# Patient Record
Sex: Male | Born: 1980 | Race: White | Hispanic: No | Marital: Married | Smoking: Never smoker
Health system: Southern US, Community
[De-identification: ages and names within clinical notes are randomized; demographics above are authoritative.]

---

## 2005-02-16 ENCOUNTER — Emergency Department: Payer: Self-pay | Admitting: Emergency Medicine

## 2006-02-02 HISTORY — PX: NASAL SINUS SURGERY: SHX719

## 2006-11-27 ENCOUNTER — Emergency Department: Payer: Self-pay | Admitting: Emergency Medicine

## 2007-01-13 ENCOUNTER — Ambulatory Visit: Payer: Self-pay | Admitting: Otolaryngology

## 2007-08-19 ENCOUNTER — Emergency Department: Payer: Self-pay | Admitting: Emergency Medicine

## 2009-02-18 ENCOUNTER — Ambulatory Visit: Payer: Self-pay | Admitting: Gastroenterology

## 2011-03-30 ENCOUNTER — Emergency Department: Payer: Self-pay | Admitting: General Practice

## 2011-07-31 ENCOUNTER — Ambulatory Visit (INDEPENDENT_AMBULATORY_CARE_PROVIDER_SITE_OTHER): Payer: PRIVATE HEALTH INSURANCE | Admitting: Internal Medicine

## 2011-07-31 ENCOUNTER — Encounter: Payer: Self-pay | Admitting: Internal Medicine

## 2011-07-31 VITALS — BP 136/82 | HR 85 | Temp 98.6°F | Resp 16 | Ht 73.0 in | Wt 211.8 lb

## 2011-07-31 DIAGNOSIS — Z0000 Encounter for general adult medical examination without abnormal findings: Secondary | ICD-10-CM

## 2011-07-31 DIAGNOSIS — L255 Unspecified contact dermatitis due to plants, except food: Secondary | ICD-10-CM

## 2011-07-31 DIAGNOSIS — L309 Dermatitis, unspecified: Secondary | ICD-10-CM

## 2011-07-31 DIAGNOSIS — R030 Elevated blood-pressure reading, without diagnosis of hypertension: Secondary | ICD-10-CM

## 2011-07-31 DIAGNOSIS — L259 Unspecified contact dermatitis, unspecified cause: Secondary | ICD-10-CM

## 2011-07-31 DIAGNOSIS — M7552 Bursitis of left shoulder: Secondary | ICD-10-CM

## 2011-07-31 DIAGNOSIS — M719 Bursopathy, unspecified: Secondary | ICD-10-CM

## 2011-07-31 LAB — CBC WITH DIFFERENTIAL/PLATELET
Basophils Absolute: 0.1 10*3/uL (ref 0.0–0.1)
Basophils Relative: 1 % (ref 0–1)
Eosinophils Absolute: 0.4 10*3/uL (ref 0.0–0.7)
HCT: 46.8 % (ref 39.0–52.0)
Hemoglobin: 16 g/dL (ref 13.0–17.0)
MCH: 29.3 pg (ref 26.0–34.0)
MCHC: 34.2 g/dL (ref 30.0–36.0)
Monocytes Absolute: 0.5 10*3/uL (ref 0.1–1.0)
Monocytes Relative: 7 % (ref 3–12)
Neutro Abs: 4.1 10*3/uL (ref 1.7–7.7)
Neutrophils Relative %: 51 % (ref 43–77)
RDW: 13.7 % (ref 11.5–15.5)

## 2011-07-31 MED ORDER — PREDNISONE (PAK) 10 MG PO TABS: ORAL_TABLET | ORAL | Status: AC

## 2011-07-31 NOTE — Patient Instructions (Addendum)
Get your bp and pulse checked a few times over the next  Couple of week  If the numbers  Are consistently > 140/80,  We will initiate treatment   I am prescribing a 10 day course of prednisone for your dermatitis and shoulder pain  If the rash gets worse,  Call immediately   Try allegra for daytime itching fenxofenadine 180 mg dose once daily

## 2011-07-31 NOTE — Progress Notes (Signed)
Patient ID: Anthony [name], male   DOB: Dec 10, 1980, 31 y.o.   MRN: [id number] Patient Active Problem List  Diagnosis  . Elevated blood pressure reading without diagnosis of hypertension  . Contact dermatitis and other eczema due to plants (except food)  . Bursitis of left shoulder    Subjective:  CC:   Chief Complaint  Patient presents with  . Annual Exam    HPI:   Anthony [name] a 31 y.o. male who presents for an Annual exam  Cc left shoulder pain aggravated by weight lifting anterior pain.  FH of temporal arteritis in grandfather,  Patient has persistent bilateral temple pain for months, no  vision changes . 3rd issue contact dermatitis chest  forearms legs after contact with bales hay. Still working 2 week of nights, 2 weeks of days for [employer].    History reviewed. No pertinent past medical history.  History reviewed. No pertinent past surgical history.       The following portions of the patient's history were reviewed and updated as appropriate: Allergies, current medications, and problem list.    Review of Systems:   12 Pt  review of systems was negative except those addressed in the HPI,     History   Social History  . Marital Status: Married    Spouse Name: N/A    Number of Children: N/A  . Years of Education: N/A   Occupational History  . Not on file.   Social History Main Topics  . Smoking status: Never Smoker   . Smokeless tobacco: Never Used  . Alcohol Use: Yes  . Drug Use: No  . Sexually Active: Not on file   Other Topics Concern  . Not on file   Social History Narrative  . No narrative on file    Objective:  BP 136/82  Pulse 85  Temp 98.6 F (37 C) (Oral)  Resp 16  Ht 6\' 1"  (1.854 m)  Wt 211 lb 12 oz (96.049 kg)  BMI 27.94 kg/m2  SpO2 96% BP 136/82  Pulse 85  Temp 98.6 F (37 C) (Oral)  Resp 16  Ht 6\' 1"  (1.854 m)  Wt 211 lb 12 oz (96.049 kg)  BMI 27.94 kg/m2  SpO2 96%  General Appearance:    Alert, cooperative,  no distress, appears stated age  Head:    Normocephalic, without obvious abnormality, atraumatic  Eyes:    PERRL, conjunctiva/corneas clear, EOM's intact, fundi    benign, both eyes       Ears:    Normal TM's and external ear canals, both ears  Nose:   Nares normal, septum midline, mucosa normal, no drainage   or sinus tenderness  Throat:   Lips, mucosa, and tongue normal; teeth and gums normal  Neck:   Supple, symmetrical, trachea midline, no adenopathy;       thyroid:  No enlargement/tenderness/nodules; no carotid   bruit or JVD  Back:     Symmetric, no curvature, ROM normal, no CVA tenderness  Lungs:     Clear to auscultation bilaterally, respirations unlabored  Chest wall:    No tenderness or deformity  Heart:    Regular rate and rhythm, S1 and S2 normal, no murmur, rub   or gallop  Abdomen:     Soft, non-tender, bowel sounds active all four quadrants,    no masses, no organomegaly  Genitalia:    Normal male without lesion, discharge or tenderness     Extremities:   Extremities normal, atraumatic, no  cyanosis or edema  Pulses:   2+ and symmetric all extremities  Skin:   Skin color, texture, turgor normal, no rashes or lesions  Lymph nodes:   Cervical, supraclavicular, and axillary nodes normal  Neurologic:   CNII-XII intact. Normal strength, sensation and reflexes      throughout       Assessment and Plan:  Elevated blood pressure reading without diagnosis of hypertension He was asked to check bp and pulse several times will over the next few weeks . Will start therapy if elevated.   Contact dermatitis and other eczema due to plants (except food) Steroid taper prescribed  Bursitis of left shoulder Steroid injection discussed but deferred since we are using systemic steroids for dermatitis.   Updated Medication List Outpatient Encounter Prescriptions as of 07/31/2011  Medication Sig Dispense Refill  . predniSONE (STERAPRED UNI-PAK) 10 MG tablet 6 tablets daily for 3  days,  Then taper by 1 tablet daily until gone  21 tablet  0     Orders Placed This Encounter  Procedures  . COMPLETE METABOLIC PANEL WITH GFR  . CBC with Differential  . TSH  . HIV Antibody ( Reflex)  . Hepatitis C antibody    No Follow-up on file.

## 2011-08-01 LAB — COMPLETE METABOLIC PANEL WITH GFR
ALT: 30 U/L (ref 0–53)
Albumin: 4.6 g/dL (ref 3.5–5.2)
CO2: 27 mEq/L (ref 19–32)
GFR, Est African American: >89 mL/min
GFR, Est Non African American: 85 mL/min
Glucose, Bld: 80 mg/dL (ref 70–99)
Potassium: 4.1 mEq/L (ref 3.5–5.3)
Sodium: 142 mEq/L (ref 135–145)
Total Bilirubin: 1.1 mg/dL (ref 0.3–1.2)
Total Protein: 6.6 g/dL (ref 6.0–8.3)

## 2011-08-02 DIAGNOSIS — M7552 Bursitis of left shoulder: Secondary | ICD-10-CM | POA: Insufficient documentation

## 2011-08-02 DIAGNOSIS — L255 Unspecified contact dermatitis due to plants, except food: Secondary | ICD-10-CM | POA: Insufficient documentation

## 2011-08-02 DIAGNOSIS — R030 Elevated blood-pressure reading, without diagnosis of hypertension: Secondary | ICD-10-CM | POA: Insufficient documentation

## 2011-08-02 NOTE — Assessment & Plan Note (Signed)
Steroid injection discussed but deferred since we are using systemic steroids for dermatitis.

## 2011-08-02 NOTE — Assessment & Plan Note (Signed)
He was asked to check bp and pulse several times will over the next few weeks . Will start therapy if elevated.

## 2011-08-02 NOTE — Assessment & Plan Note (Signed)
Steroid taper prescribed

## 2011-11-10 ENCOUNTER — Encounter: Payer: Self-pay | Admitting: Internal Medicine

## 2012-01-08 ENCOUNTER — Ambulatory Visit (INDEPENDENT_AMBULATORY_CARE_PROVIDER_SITE_OTHER): Payer: PRIVATE HEALTH INSURANCE | Admitting: Internal Medicine

## 2012-01-08 ENCOUNTER — Encounter: Payer: Self-pay | Admitting: Internal Medicine

## 2012-01-08 VITALS — BP 124/70 | HR 82 | Temp 98.1°F | Resp 12 | Ht 72.0 in | Wt 229.8 lb

## 2012-01-08 DIAGNOSIS — IMO0002 Reserved for concepts with insufficient information to code with codable children: Secondary | ICD-10-CM

## 2012-01-08 DIAGNOSIS — M75100 Unspecified rotator cuff tear or rupture of unspecified shoulder, not specified as traumatic: Secondary | ICD-10-CM

## 2012-01-08 DIAGNOSIS — M7550 Bursitis of unspecified shoulder: Secondary | ICD-10-CM

## 2012-01-08 MED ORDER — MELOXICAM 7.5 MG PO TABS: 7.5000 mg | ORAL_TABLET | Freq: Every day | ORAL | Status: DC

## 2012-01-08 MED ORDER — TRAMADOL HCL 50 MG PO TABS: 50.0000 mg | ORAL_TABLET | Freq: Four times a day (QID) | ORAL | Status: DC | PRN

## 2012-01-08 NOTE — Patient Instructions (Signed)
I injected your shoulder with DepoMedrol and lidocaine today for sub acromial bursitis.  PLEASE restrict your overhead use of arm for 2 weeks.  PLEASE DO NOT BENCH PRESS OR SHOULDER PRESS for as long as you can possibly stand it (2 weeks)   Ice shoulder frequently for up to 15 minutes  Ok to take meloxicam once daily and tramadol (50 mg every 6 hours ) as needed for pain

## 2012-01-08 NOTE — Assessment & Plan Note (Addendum)
Left shoulder,  Informed consent obtained.  Injection of depo medrol , 40 mg  And 2 mg of 2%  xylocaine given .  Patient advised to abstian fro mall ovewhead lifting with arm for 2 week minimum and avoid bench and shoulder press as well.

## 2012-01-10 ENCOUNTER — Encounter: Payer: Self-pay | Admitting: Internal Medicine

## 2012-01-10 MED ORDER — LIDOCAINE HCL 2 % IJ SOLN: 2.5000 mL | Freq: Once | INTRAMUSCULAR | Status: DC

## 2012-01-10 MED ORDER — METHYLPREDNISOLONE ACETATE 40 MG/ML IJ SUSP: 40.0000 mg | Freq: Once | INTRAMUSCULAR | Status: DC

## 2012-01-10 NOTE — Progress Notes (Addendum)
Patient ID: Anthony [name], male   DOB: [birth year], 31 y.o.   MRN: [id number]  Patient Active Problem List  Diagnosis  . Elevated blood pressure reading without diagnosis of hypertension  . Contact dermatitis and other eczema due to plants (except food)  . Bursitis of left shoulder  . Subacromial bursitis    Subjective:  CC:   Chief Complaint  Patient presents with  . Shoulder Pain    HPI:   Anthony [name] a 31 y.o. male who presents Persistent left shoulder pain. Patient is requesting a steroid injection in his shoulder for continued pain aggravated by bench pressing and shoulder press.  There is no history of trauma or steroid use. No weakness or pain radiating past the the deltoid muscle. No other joints affected.    History reviewed. No pertinent past medical history.  History reviewed. No pertinent past surgical history.     . lidocaine  2.5 mL Other Once  . methylPREDNISolone acetate  40 mg Intra-articular Once     The following portions of the patient's history were reviewed and updated as appropriate: Allergies, current medications, and problem list.    Review of Systems:   12 Pt  review of systems was negative except those addressed in the HPI,     History   Social History  . Marital Status: Married    Spouse Name: N/A    Number of Children: N/A  . Years of Education: N/A   Occupational History  . Not on file.   Social History Main Topics  . Smoking status: Never Smoker   . Smokeless tobacco: Never Used  . Alcohol Use: Yes  . Drug Use: No  . Sexually Active: Not on file   Other Topics Concern  . Not on file   Social History Narrative  . No narrative on file    Objective:  BP 124/70  Pulse 82  Temp 98.1 F (36.7 C) (Oral)  Resp 12  Ht 6' (1.829 m)  Wt 229 lb 12 oz (104.214 kg)  BMI 31.16 kg/m2  SpO2 97%  General appearance: alert, cooperative and appears stated age  Neck: no adenopathy, no carotid bruit, supple, symmetrical,  trachea midline and thyroid not enlarged, symmetric, no tenderness/mass/nodules Back: symmetric, no curvature. ROM normal. No CVA tenderness. Lungs: clear to auscultation bilaterally Heart: regular rate and rhythm, S1, S2 normal, no murmur, click, rub or gallop Abdomen: soft, non-tender; bowel sounds normal; no masses,  no organomegaly Pulses: 2+ and symmetric Skin: Skin color, texture, turgor normal. No rashes or lesions MSL" left shoulder without erythema.  Pt tenderness noted at humeral head aggravated by overhead motion and forced abduction   Assessment and Plan:  Subacromial bursitis Left shoulder,  Informed consent obtained.  Injection of depo medrol , 40 mg  And 2 mg of 2%  xylocaine given .  Patient advised to abstian fro mall ovewhead lifting with arm for 2 week minimum and avoid bench and shoulder press as well.    Updated Medication List Outpatient Encounter Prescriptions as of 01/08/2012  Medication Sig Dispense Refill  . meloxicam (MOBIC) 7.5 MG tablet Take 1 tablet (7.5 mg total) by mouth daily.  30 tablet  0  . traMADol (ULTRAM) 50 MG tablet Take 1 tablet (50 mg total) by mouth every 6 (six) hours as needed for pain.  60 tablet  3   Facility-Administered Encounter Medications as of 01/08/2012  Medication Dose Route Frequency Provider Last Rate Last Dose  . lidocaine (XYLOCAINE) 2 % (  with pres) injection 50 mg  2.5 mL Other Once Sherlene Shams, MD      . methylPREDNISolone acetate (DEPO-MEDROL) injection 40 mg  40 mg Intra-articular Once Sherlene Shams, MD         No orders of the defined types were placed in this encounter.    No Follow-up on file.

## 2012-02-07 ENCOUNTER — Emergency Department: Payer: Self-pay | Admitting: Emergency Medicine

## 2012-11-02 ENCOUNTER — Encounter: Payer: Self-pay | Admitting: *Deleted

## 2012-11-03 ENCOUNTER — Ambulatory Visit (INDEPENDENT_AMBULATORY_CARE_PROVIDER_SITE_OTHER): Payer: PRIVATE HEALTH INSURANCE | Admitting: Internal Medicine

## 2012-11-03 ENCOUNTER — Encounter: Payer: Self-pay | Admitting: Internal Medicine

## 2012-11-03 VITALS — BP 112/82 | HR 81 | Temp 98.1°F | Resp 14 | Ht 73.75 in | Wt 206.2 lb

## 2012-11-03 DIAGNOSIS — A6920 Lyme disease, unspecified: Secondary | ICD-10-CM

## 2012-11-03 DIAGNOSIS — R5381 Other malaise: Secondary | ICD-10-CM

## 2012-11-03 DIAGNOSIS — Z13220 Encounter for screening for lipoid disorders: Secondary | ICD-10-CM

## 2012-11-03 DIAGNOSIS — Z8619 Personal history of other infectious and parasitic diseases: Secondary | ICD-10-CM

## 2012-11-03 DIAGNOSIS — Z113 Encounter for screening for infections with a predominantly sexual mode of transmission: Secondary | ICD-10-CM

## 2012-11-03 DIAGNOSIS — Z0000 Encounter for general adult medical examination without abnormal findings: Secondary | ICD-10-CM

## 2012-11-03 NOTE — Patient Instructions (Signed)
Return for fasting labs.

## 2012-11-03 NOTE — Progress Notes (Signed)
Patient ID: Anthony [name], male   DOB: [birth year], 32 y.o.   MRN: [id number]   The patient is here for his annual male physical examination and management of other chronic and acute problems.  He was treated for Lymes disease a few months ago by [facility] Symptoms included headaches, malaise,  Joint pain and abdominal pain .   Per patient the Lyme test was positive.  Possible exposures include his work as a  Emergency planning/management officer in [employer] requiring him spending a lot of time in [employer], and had been to [facility]   Shoulder still aching but he did not rest after the steroid injection.  The pain is aggravated by bench pressing .  His wife is a massage therapist and she is treating it with several complementary therapies. .   Takes creatine and several other supplements recommended by his trainer for body building but does not use steroids.  He drinks a gallon of water daily.     The risk factors are reflected in the social history.  Home safety : The patient has smoke detectors in the home. He wears seatbelts.  There are  firearms at home. There is no violence in the home.   There is occupational  risk for hepatitis, and  HIV. There is no   history of blood transfusion. There is no travel history to infectious disease endemic areas of the world.  The patient has seen their dentist in the last six month and  their eye doctor in the last year.  They do not  have excessive sun exposure. They have seen a dermatoloigist in the last year. Discussed the need for sun protection: hats, long sleeves and use of sunscreen if there is significant sun exposure.   Diet: the importance of a healthy diet is discussed. They do have a healthy diet.  Depression screen: there are no signs or vegative symptoms of depression- irritability, change in appetite, anhedonia, sadness/tearfullness.  The following portions of the patient's history were reviewed and updated as appropriate: allergies,  current medications, past family history, past medical history,  past surgical history, past social history  and problem list.  Visual acuity was not assessed per patient preference since he has regular follow up with his ophthalmologist. Hearing and body mass index were assessed and reviewed.   During the course of the visit the patient was educated and counseled about appropriate screening and preventive services including :  nutrition counseling, colorectal cancer screening, and recommended immunizations.    Objective:  BP 112/82  Pulse 81  Temp(Src) 98.1 F (36.7 C) (Oral)  Resp 14  Ht 6' 1.75" (1.873 m)  Wt 206 lb 4 oz (93.554 kg)  BMI 26.67 kg/m2  SpO2 99%  BP 112/82  Pulse 81  Temp(Src) 98.1 F (36.7 C) (Oral)  Resp 14  Ht 6' 1.75" (1.873 m)  Wt 206 lb 4 oz (93.554 kg)  BMI 26.67 kg/m2  SpO2 99%  General Appearance:    Alert, cooperative, no distress, appears stated age  Head:    Normocephalic, without obvious abnormality, atraumatic  Eyes:    PERRL, conjunctiva/corneas clear, EOM's intact, fundi    benign, both eyes       Ears:    Normal TM's and external ear canals, both ears  Nose:   Nares normal, septum midline, mucosa normal, no drainage   or sinus tenderness  Throat:   Lips, mucosa, and tongue normal; teeth and gums normal  Neck:  Supple, symmetrical, trachea midline, no adenopathy;       thyroid:  No enlargement/tenderness/nodules; no carotid   bruit or JVD  Back:     Symmetric, no curvature, ROM normal, no CVA tenderness  Lungs:     Clear to auscultation bilaterally, respirations unlabored  Chest wall:    No tenderness or deformity  Heart:    Regular rate and rhythm, S1 and S2 normal, no murmur, rub   or gallop  Abdomen:     Soft, non-tender, bowel sounds active all four quadrants,    no masses, no organomegaly  Genitalia:    Normal male without lesion, discharge or tenderness  Rectal:    Normal tone, normal prostate, no masses or tenderness;   guaiac  negative stool  Extremities:   Extremities normal, atraumatic, no cyanosis or edema  Pulses:   2+ and symmetric all extremities  Skin:   Skin color, texture, turgor normal, no rashes or lesions  Lymph nodes:   Cervical, supraclavicular, and axillary nodes normal  Neurologic:   CNII-XII intact. Normal strength, sensation and reflexes      throughout   Assessment and Plan:  Encounter for preventive health examination Annual male exam was done including testicular and hernia  Exam. He will return for fasting labs and screening for HIV/Hep C.  Will recommend screening for TB as well.    H/o Lyme disease Per patient, treated by outside clinic. Repeat antibody testing ordered.  symptoms have resolved.    Updated Medication List Outpatient Encounter Prescriptions as of 11/03/2012  Medication Sig Dispense Refill  . [DISCONTINUED] meloxicam (MOBIC) 7.5 MG tablet Take 1 tablet (7.5 mg total) by mouth daily.  30 tablet  0  . [DISCONTINUED] traMADol (ULTRAM) 50 MG tablet Take 1 tablet (50 mg total) by mouth every 6 (six) hours as needed for pain.  60 tablet  3   Facility-Administered Encounter Medications as of 11/03/2012  Medication Dose Route Frequency Provider Last Rate Last Dose  . lidocaine (XYLOCAINE) 2 % (with pres) injection 50 mg  2.5 mL Other Once Sherlene Shams, MD      . methylPREDNISolone acetate (DEPO-MEDROL) injection 40 mg  40 mg Intra-articular Once Sherlene Shams, MD

## 2012-11-06 ENCOUNTER — Encounter: Payer: Self-pay | Admitting: Internal Medicine

## 2012-11-06 DIAGNOSIS — Z8619 Personal history of other infectious and parasitic diseases: Secondary | ICD-10-CM | POA: Insufficient documentation

## 2012-11-06 NOTE — Assessment & Plan Note (Signed)
Annual male exam was done including testicular and hernia  Exam. He will return for fasting labs and screening for HIV/Hep C.  Will recommend screening for TB as well.

## 2012-11-06 NOTE — Assessment & Plan Note (Signed)
Per patient, treated by outside clinic. Repeat antibody testing ordered.  symptoms have resolved.

## 2012-11-16 ENCOUNTER — Other Ambulatory Visit (INDEPENDENT_AMBULATORY_CARE_PROVIDER_SITE_OTHER): Payer: PRIVATE HEALTH INSURANCE

## 2012-11-16 DIAGNOSIS — Z113 Encounter for screening for infections with a predominantly sexual mode of transmission: Secondary | ICD-10-CM

## 2012-11-16 DIAGNOSIS — R5381 Other malaise: Secondary | ICD-10-CM

## 2012-11-16 DIAGNOSIS — Z8619 Personal history of other infectious and parasitic diseases: Secondary | ICD-10-CM

## 2012-11-16 DIAGNOSIS — Z13220 Encounter for screening for lipoid disorders: Secondary | ICD-10-CM

## 2012-11-16 LAB — COMPREHENSIVE METABOLIC PANEL
ALT: 44 U/L (ref 0–53)
AST: 34 U/L (ref 0–37)
Alkaline Phosphatase: 55 U/L (ref 39–117)
BUN: 23 mg/dL (ref 6–23)
Chloride: 102 mEq/L (ref 96–112)
Creatinine, Ser: 1 mg/dL (ref 0.4–1.5)
Total Bilirubin: 2.3 mg/dL — ABNORMAL HIGH (ref 0.3–1.2)

## 2012-11-16 LAB — CBC WITH DIFFERENTIAL/PLATELET
Basophils Absolute: 0 10*3/uL (ref 0.0–0.1)
Basophils Relative: 0.6 % (ref 0.0–3.0)
Eosinophils Absolute: 0.2 10*3/uL (ref 0.0–0.7)
Hemoglobin: 16.5 g/dL (ref 13.0–17.0)
Lymphocytes Relative: 32.1 % (ref 12.0–46.0)
Lymphs Abs: 2.1 10*3/uL (ref 0.7–4.0)
MCHC: 34.4 g/dL (ref 30.0–36.0)
MCV: 88 fl (ref 78.0–100.0)
Monocytes Absolute: 0.6 10*3/uL (ref 0.1–1.0)
Neutro Abs: 3.5 10*3/uL (ref 1.4–7.7)
RBC: 5.44 Mil/uL (ref 4.22–5.81)
RDW: 12.8 % (ref 11.5–14.6)
WBC: 6.5 10*3/uL (ref 4.5–10.5)

## 2012-11-16 LAB — LIPID PANEL
HDL: 54.7 mg/dL (ref >39.00)
Total CHOL/HDL Ratio: 3
VLDL: 12.4 mg/dL (ref 0.0–40.0)

## 2012-11-16 LAB — TSH: TSH: 2.12 u[IU]/mL (ref 0.35–5.50)

## 2012-11-17 ENCOUNTER — Encounter: Payer: Self-pay | Admitting: Internal Medicine

## 2012-11-17 LAB — LYME ABY, WSTRN BLT IGG & IGM W/BANDS
B burgdorferi IgG Abs (IB): NEGATIVE
Lyme Disease 23 kD IgG: NONREACTIVE
Lyme Disease 23 kD IgM: NONREACTIVE
Lyme Disease 39 kD IgG: NONREACTIVE
Lyme Disease 39 kD IgM: NONREACTIVE
Lyme Disease 41 kD IgG: NONREACTIVE
Lyme Disease 45 kD IgG: NONREACTIVE
Lyme Disease 58 kD IgG: NONREACTIVE
Lyme Disease 66 kD IgG: NONREACTIVE
Lyme Disease 93 kD IgG: NONREACTIVE

## 2012-11-17 LAB — HEPATITIS C ANTIBODY: HCV Ab: NEGATIVE

## 2012-11-17 LAB — HIV ANTIBODY (ROUTINE TESTING W REFLEX): HIV: NONREACTIVE

## 2012-11-20 ENCOUNTER — Encounter: Payer: Self-pay | Admitting: Internal Medicine

## 2012-11-21 NOTE — Telephone Encounter (Signed)
Mailed unread message to pt  

## 2012-11-22 NOTE — Telephone Encounter (Signed)
Mailed unread message to pt  

## 2012-12-08 ENCOUNTER — Other Ambulatory Visit: Payer: Self-pay

## 2013-12-08 ENCOUNTER — Ambulatory Visit (INDEPENDENT_AMBULATORY_CARE_PROVIDER_SITE_OTHER): Payer: PRIVATE HEALTH INSURANCE | Admitting: Internal Medicine

## 2013-12-08 ENCOUNTER — Encounter: Payer: Self-pay | Admitting: Internal Medicine

## 2013-12-08 VITALS — BP 118/76 | HR 70 | Temp 98.4°F | Resp 16 | Ht 74.0 in | Wt 205.0 lb

## 2013-12-08 DIAGNOSIS — Z1159 Encounter for screening for other viral diseases: Secondary | ICD-10-CM

## 2013-12-08 DIAGNOSIS — G8929 Other chronic pain: Secondary | ICD-10-CM

## 2013-12-08 DIAGNOSIS — M6281 Muscle weakness (generalized): Secondary | ICD-10-CM

## 2013-12-08 DIAGNOSIS — Z0000 Encounter for general adult medical examination without abnormal findings: Secondary | ICD-10-CM

## 2013-12-08 DIAGNOSIS — Z13220 Encounter for screening for lipoid disorders: Secondary | ICD-10-CM

## 2013-12-08 DIAGNOSIS — R5383 Other fatigue: Secondary | ICD-10-CM

## 2013-12-08 DIAGNOSIS — M25512 Pain in left shoulder: Secondary | ICD-10-CM

## 2013-12-08 DIAGNOSIS — M7552 Bursitis of left shoulder: Secondary | ICD-10-CM

## 2013-12-08 LAB — COMPREHENSIVE METABOLIC PANEL
ALT: 53 U/L (ref 0–53)
AST: 29 U/L (ref 0–37)
Albumin: 4.6 g/dL (ref 3.5–5.2)
Alkaline Phosphatase: 62 U/L (ref 39–117)
BILIRUBIN TOTAL: 0.8 mg/dL (ref 0.2–1.2)
BUN: 29 mg/dL — ABNORMAL HIGH (ref 6–23)
CO2: 27 mEq/L (ref 19–32)
CREATININE: 1.03 mg/dL (ref 0.50–1.35)
Calcium: 9.8 mg/dL (ref 8.4–10.5)
Chloride: 99 mEq/L (ref 96–112)
Glucose, Bld: 83 mg/dL (ref 70–99)
Potassium: 5 mEq/L (ref 3.5–5.3)
Sodium: 139 mEq/L (ref 135–145)
Total Protein: 6.9 g/dL (ref 6.0–8.3)

## 2013-12-08 LAB — LIPID PANEL
CHOLESTEROL: 187 mg/dL (ref 0–200)
HDL: 55 mg/dL (ref >39)
LDL Cholesterol: 115 mg/dL — ABNORMAL HIGH (ref 0–99)
TRIGLYCERIDES: 85 mg/dL (ref <150)
Total CHOL/HDL Ratio: 3.4 Ratio
VLDL: 17 mg/dL (ref 0–40)

## 2013-12-08 LAB — CBC WITH DIFFERENTIAL/PLATELET
BASOS PCT: 0 % (ref 0–1)
Basophils Absolute: 0 10*3/uL (ref 0.0–0.1)
Eosinophils Absolute: 0.2 10*3/uL (ref 0.0–0.7)
Eosinophils Relative: 3 % (ref 0–5)
HCT: 49.2 % (ref 39.0–52.0)
Hemoglobin: 16.9 g/dL (ref 13.0–17.0)
Lymphocytes Relative: 39 % (ref 12–46)
Lymphs Abs: 2.8 10*3/uL (ref 0.7–4.0)
MCH: 30.1 pg (ref 26.0–34.0)
MCHC: 34.3 g/dL (ref 30.0–36.0)
MCV: 87.7 fL (ref 78.0–100.0)
MONO ABS: 0.6 10*3/uL (ref 0.1–1.0)
Monocytes Relative: 8 % (ref 3–12)
NEUTROS ABS: 3.7 10*3/uL (ref 1.7–7.7)
NEUTROS PCT: 50 % (ref 43–77)
Platelets: 173 10*3/uL (ref 150–400)
RBC: 5.61 MIL/uL (ref 4.22–5.81)
RDW: 13.3 % (ref 11.5–15.5)
WBC: 7.3 10*3/uL (ref 4.0–10.5)

## 2013-12-08 LAB — TSH: TSH: 2.239 u[IU]/mL (ref 0.350–4.500)

## 2013-12-08 LAB — CK: Total CK: 557 U/L — ABNORMAL HIGH (ref 7–232)

## 2013-12-08 NOTE — Progress Notes (Signed)
Pre-visit discussion using our clinic review tool. No additional management support is needed unless otherwise documented below in the visit note.  

## 2013-12-08 NOTE — Patient Instructions (Signed)
You had your annual  wellness exam today.    We will contact you with the bloodwork results via MyChart  I recommend reducing your intake of beef to 3 times weekly.  Substitute lamb,  pork, venison, lamb and fish.  Consider drinking a/mond/cocounut milk,  The unsweetened variety to increased dietary calcium without cholesterol   Health Maintenance A healthy lifestyle and preventative care can promote health and wellness.  Maintain regular health, dental, and eye exams.  Eat a healthy diet. Foods like vegetables, fruits, whole grains, low-fat dairy products, and lean protein foods contain the nutrients you need and are low in calories. Decrease your intake of foods high in solid fats, added sugars, and salt. Get information about a proper diet from your health care provider, if necessary.  Regular physical exercise is one of the most important things you can do for your health. Most adults should get at least 150 minutes of moderate-intensity exercise (any activity that increases your heart rate and causes you to sweat) each week. In addition, most adults need muscle-strengthening exercises on 2 or more days a week.   Maintain a healthy weight. The body mass index (BMI) is a screening tool to identify possible weight problems. It provides an estimate of body fat based on height and weight. Your health care provider can find your BMI and can help you achieve or maintain a healthy weight. For males 20 years and older:  A BMI below 18.5 is considered underweight.  A BMI of 18.5 to 24.9 is normal.  A BMI of 25 to 29.9 is considered overweight.  A BMI of 30 and above is considered obese.  Maintain normal blood lipids and cholesterol by exercising and minimizing your intake of saturated fat. Eat a balanced diet with plenty of fruits and vegetables. Blood tests for lipids and cholesterol should begin at age 90 and be repeated every 5 years. If your lipid or cholesterol levels are high, you are  over age 62, or you are at high risk for heart disease, you may need your cholesterol levels checked more frequently.Ongoing high lipid and cholesterol levels should be treated with medicines if diet and exercise are not working.  If you smoke, find out from your health care provider how to quit. If you do not use tobacco, do not start.  Lung cancer screening is recommended for adults aged 48-80 years who are at high risk for developing lung cancer because of a history of smoking. A yearly low-dose CT scan of the lungs is recommended for people who have at least a 30-pack-year history of smoking and are current smokers or have quit within the past 15 years. A pack year of smoking is smoking an average of 1 pack of cigarettes a day for 1 year (for example, a 30-pack-year history of smoking could mean smoking 1 pack a day for 30 years or 2 packs a day for 15 years). Yearly screening should continue until the smoker has stopped smoking for at least 15 years. Yearly screening should be stopped for people who develop a health problem that would prevent them from having lung cancer treatment.  If you choose to drink alcohol, do not have more than 2 drinks per day. One drink is considered to be 12 oz (360 mL) of beer, 5 oz (150 mL) of wine, or 1.5 oz (45 mL) of liquor.  Avoid the use of street drugs. Do not share needles with anyone. Ask for help if you need support or instructions  about stopping the use of drugs.  High blood pressure causes heart disease and increases the risk of stroke. Blood pressure should be checked at least every 1-2 years. Ongoing high blood pressure should be treated with medicines if weight loss and exercise are not effective.  If you are 6-74 years old, ask your health care provider if you should take aspirin to prevent heart disease.  Diabetes screening involves taking a blood sample to check your fasting blood sugar level. This should be done once every 3 years after age 12 if  you are at a normal weight and without risk factors for diabetes. Testing should be considered at a younger age or be carried out more frequently if you are overweight and have at least 1 risk factor for diabetes.  Colorectal cancer can be detected and often prevented. Most routine colorectal cancer screening begins at the age of 41 and continues through age 70. However, your health care provider may recommend screening at an earlier age if you have risk factors for colon cancer. On a yearly basis, your health care provider may provide home test kits to check for hidden blood in the stool. A small camera at the end of a tube may be used to directly examine the colon (sigmoidoscopy or colonoscopy) to detect the earliest forms of colorectal cancer. Talk to your health care provider about this at age 33 when routine screening begins. A direct exam of the colon should be repeated every 5-10 years through age 80, unless early forms of precancerous polyps or small growths are found.  People who are at an increased risk for hepatitis B should be screened for this virus. You are considered at high risk for hepatitis B if:  You were born in a country where hepatitis B occurs often. Talk with your health care provider about which countries are considered high risk.  Your parents were born in a high-risk country and you have not received a shot to protect against hepatitis B (hepatitis B vaccine).  You have HIV or AIDS.  You use needles to inject street drugs.  You live with, or have sex with, someone who has hepatitis B.  You are a man who has sex with other men (MSM).  You get hemodialysis treatment.  You take certain medicines for conditions like cancer, organ transplantation, and autoimmune conditions.  Hepatitis C blood testing is recommended for all people born from [birth year] through 1967 and any individual with known risk factors for hepatitis C.  Healthy men should no longer receive prostate-specific  antigen (PSA) blood tests as part of routine cancer screening. Talk to your health care provider about prostate cancer screening.  Testicular cancer screening is not recommended for adolescents or adult males who have no symptoms. Screening includes self-exam, a health care provider exam, and other screening tests. Consult with your health care provider about any symptoms you have or any concerns you have about testicular cancer.  Practice safe sex. Use condoms and avoid high-risk sexual practices to reduce the spread of sexually transmitted infections (STIs).  You should be screened for STIs, including gonorrhea and chlamydia if:  You are sexually active and are younger than 24 years.  You are older than 24 years, and your health care provider tells you that you are at risk for this type of infection.  Your sexual activity has changed since you were last screened, and you are at an increased risk for chlamydia or gonorrhea. Ask your health care provider if you  are at risk.  If you are at risk of being infected with HIV, it is recommended that you take a prescription medicine daily to prevent HIV infection. This is called pre-exposure prophylaxis (PrEP). You are considered at risk if:  You are a man who has sex with other men (MSM).  You are a heterosexual man who is sexually active with multiple partners.  You take drugs by injection.  You are sexually active with a partner who has HIV.  Talk with your health care provider about whether you are at high risk of being infected with HIV. If you choose to begin PrEP, you should first be tested for HIV. You should then be tested every 3 months for as long as you are taking PrEP.  Use sunscreen. Apply sunscreen liberally and repeatedly throughout the day. You should seek shade when your shadow is shorter than you. Protect yourself by wearing long sleeves, pants, a wide-brimmed hat, and sunglasses year round whenever you are outdoors.  Tell  your health care provider of new moles or changes in moles, especially if there is a change in shape or color. Also, tell your health care provider if a mole is larger than the size of a pencil eraser.  A one-time screening for abdominal aortic aneurysm (AAA) and surgical repair of large AAAs by ultrasound is recommended for men aged 48-75 years who are current or former smokers.  Stay current with your vaccines (immunizations). Document Released: 07/18/2007 Document Revised: 01/24/2013 Document Reviewed: 06/16/2010 [facility] Patient Information 2015 [place], [place]. This information is not intended to replace advice given to you by your health care provider. Make sure you discuss any questions you have with your health care provider.

## 2013-12-09 LAB — URINALYSIS, ROUTINE W REFLEX MICROSCOPIC
Bilirubin Urine: NEGATIVE
Glucose, UA: NEGATIVE mg/dL
HGB URINE DIPSTICK: NEGATIVE
Ketones, ur: NEGATIVE mg/dL
LEUKOCYTES UA: NEGATIVE
NITRITE: NEGATIVE
PH: 6 (ref 5.0–8.0)
Protein, ur: NEGATIVE mg/dL
Specific Gravity, Urine: 1.011 (ref 1.005–1.030)
Urobilinogen, UA: 0.2 mg/dL (ref 0.0–1.0)

## 2013-12-09 LAB — HEPATITIS C ANTIBODY: HCV Ab: NEGATIVE

## 2013-12-10 NOTE — Assessment & Plan Note (Signed)
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  Discussed referral to [facility] for definitive diagnosis and treatment.  If he is not covered,  Will refer to Dr Sharlet Salina at [place].

## 2013-12-10 NOTE — Assessment & Plan Note (Signed)
Annual wellness  exam was done as well as a comprehensive physical exam and management of acute and chronic conditions .  During the course of the visit the patient was educated and counseled about appropriate screening and preventive services including : , diabetes screening, nutrition counseling, , and recommended immunizations.  Printed recommendations for health maintenance screenings was given.

## 2013-12-10 NOTE — Progress Notes (Signed)
Patient ID: Anthony [name], male   DOB: [birth year], 33 y.o.   MRN: [id number]  The patient is here for his annual physical examination and management of other chronic and acute problems.  He continues to have left shoulder pain aggravated by weight lifting.    The risk factors are reflected in the social history.  The roster of all physicians providing medical care to patient - is listed in the Snapshot section of the chart.   Home safety : The patient has smoke detectors in the home. He wears seatbelts.  There are multiple  firearms at home.  He is a Engineer, structural ,  Working Clinical cytogeneticist . There is no violence in the home.   There is no risks for hepatitis, STDs or HIV. There is no   history of blood transfusion. There is no travel history to infectious disease endemic areas of the world.  The patient has seen their dentist in the last six month and  their eye doctor in the last year.  They do not  have excessive sun exposure. They have seen a dermatoloigist in the last year. Discussed the need for sun protection: hats, long sleeves and use of sunscreen if there is significant sun exposure.   Diet: the importance of a healthy diet is discussed. They do have a healthy diet, but he eats red meat 7 dasy per week.   The benefits of regular aerobic exercise were discussed. He exercises a minimum of 60 minutes  5 days per week. Depression screen: there are no signs or vegative symptoms of depression- irritability, change in appetite, anhedonia, sadness/tearfullness.  The following portions of the patient's history were reviewed and updated as appropriate: allergies, current medications, past family history, past medical history,  past surgical history, past social history  and problem list.  Visual acuity was not assessed per patient preference since he has regular follow up with his ophthalmologist. Hearing and body mass index were assessed and reviewed.   During the course of the visit the  patient was educated and counseled about appropriate screening and preventive services including :  nutrition counseling, colorectal cancer screening, and recommended immunizations.    Objective:  :BP 118/76 mmHg  Pulse 70  Temp(Src) 98.4 F (36.9 C) (Oral)  Resp 16  Ht 6\' 2"  (1.88 m)  Wt 205 lb (92.987 kg)  BMI 26.31 kg/m2  SpO2 96%  General Appearance:    Heavily muscled, Alert, cooperative, no distress, appears stated age  Head:    Normocephalic, without obvious abnormality, atraumatic  Eyes:    PERRL, conjunctiva/corneas clear, EOM's intact, fundi    benign, both eyes       Ears:    Normal TM's and external ear canals, both ears  Nose:   Nares normal, septum midline, mucosa normal, no drainage   or sinus tenderness  Throat:   Lips, mucosa, and tongue normal; teeth and gums normal  Neck:   Supple, symmetrical, trachea midline, no adenopathy;       thyroid:  No enlargement/tenderness/nodules; no carotid   bruit or JVD  Back:     Symmetric, no curvature, ROM normal, no CVA tenderness  Lungs:     Clear to auscultation bilaterally, respirations unlabored  Chest wall:    No tenderness or deformity  Heart:    Regular rate and rhythm, S1 and S2 normal, no murmur, rub   or gallop  Abdomen:     Soft, non-tender, bowel sounds active all four quadrants,  no masses, no organomegaly        Extremities:   Extremities normal, atraumatic, no cyanosis or edema  Pulses:   2+ and symmetric all extremities  Skin:   Skin color, texture, turgor normal, no rashes or lesions  Lymph nodes:   Cervical, supraclavicular, and axillary nodes normal  Neurologic:   CNII-XII intact. Normal strength, sensation and reflexes      throughout    Assessment and Plan:  Encounter for preventive health examination Annual wellness  exam was done as well as a comprehensive physical exam and management of acute and chronic conditions .  During the course of the visit the patient was educated and counseled about  appropriate screening and preventive services including : , diabetes screening, nutrition counseling, , and recommended immunizations.  Printed recommendations for health maintenance screenings was given.   Subacromial bursitis Vs calcific tendonitis.  Discussed referral to Charlann Boxer for definitive diagnosis and treatment.  If he is not covered,  Will refer to Dr Sharlet Salina at [place].    Updated Medication List No outpatient encounter prescriptions on file as of 12/08/2013.

## 2013-12-11 LAB — TESTOSTERONE, FREE, TOTAL, SHBG
Sex Hormone Binding: 31 nmol/L (ref 13–71)
TESTOSTERONE: 220 ng/dL — AB (ref 300–890)
Testosterone, Free: 43.7 pg/mL — ABNORMAL LOW (ref 47.0–244.0)
Testosterone-% Free: 2 % (ref 1.6–2.9)

## 2013-12-12 ENCOUNTER — Encounter: Payer: Self-pay | Admitting: Internal Medicine

## 2013-12-20 ENCOUNTER — Ambulatory Visit: Payer: PRIVATE HEALTH INSURANCE | Admitting: Family Medicine

## 2014-02-12 ENCOUNTER — Ambulatory Visit: Payer: PRIVATE HEALTH INSURANCE | Admitting: Nurse Practitioner

## 2014-02-23 ENCOUNTER — Ambulatory Visit: Payer: PRIVATE HEALTH INSURANCE | Admitting: Family Medicine

## 2014-02-28 ENCOUNTER — Encounter: Payer: Self-pay | Admitting: Family Medicine

## 2014-02-28 ENCOUNTER — Ambulatory Visit (INDEPENDENT_AMBULATORY_CARE_PROVIDER_SITE_OTHER): Payer: PRIVATE HEALTH INSURANCE | Admitting: Family Medicine

## 2014-02-28 ENCOUNTER — Other Ambulatory Visit (INDEPENDENT_AMBULATORY_CARE_PROVIDER_SITE_OTHER): Payer: PRIVATE HEALTH INSURANCE

## 2014-02-28 VITALS — BP 120/78 | HR 69 | Ht 73.0 in | Wt 208.0 lb

## 2014-02-28 DIAGNOSIS — S43439A Superior glenoid labrum lesion of unspecified shoulder, initial encounter: Secondary | ICD-10-CM | POA: Insufficient documentation

## 2014-02-28 DIAGNOSIS — S43432A Superior glenoid labrum lesion of left shoulder, initial encounter: Secondary | ICD-10-CM

## 2014-02-28 DIAGNOSIS — M25512 Pain in left shoulder: Secondary | ICD-10-CM

## 2014-02-28 MED ORDER — NITROGLYCERIN 0.2 MG/HR TD PT24: MEDICATED_PATCH | TRANSDERMAL | Status: DC

## 2014-02-28 NOTE — Assessment & Plan Note (Signed)
Based on patient's presentation today I do believe that patient does have some labral pathology. Patient's ultrasound was fairly inconclusive. We discussed different treatment options and patient had elected to have a injection today. We also discussed icing regimen, home exercises and patient did work with the Product/process development scientist today. Patient is also to be started on nitroglycerin. We discussed side effects to this medication. Patient is going to try to make these changes as well as topical anti-inflammatories and we'll have patient come back again in 3 weeks. Patient continues to have difficulty we may want to consider further imaging. X-rays of patient's neck and shoulder were ordered today as well.

## 2014-02-28 NOTE — Progress Notes (Signed)
[name] [place], [place] [address] Phone: [phone] Subjective:    I'm seeing this patient by the request  of:  TULLO, Aris Everts, MD   CC: Left shoulder pain  WER:[id number] Anthony [name] is a 34 y.o. male coming in with complaint of left shoulder pain. 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 Patient states reaching across his body or overhead activities can be very painful. Patient states that this is all located in the left shoulder. States that he feels there is some weakness that is associated with it at this time. Seems to have gotten worse over the course last several weeks. Patient denies any radiation down the arm or any numbness or tingling. Patient rates the severity of pain a 7 out of 10. Has tried over-the-counter oral anti-inflammatories with minimal benefit. Patient can do daily activities and denies any loss of range of motion. Patient states that it can be uncomfortable at night but does not think it wakes him up at night. 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 He did have pain a proximal me 2 hours afterwards.     Past medical history, social, surgical and family history all reviewed in electronic medical record.   Review of Systems: No headache, visual changes, nausea, vomiting, diarrhea, constipation, dizziness, abdominal pain, skin rash, fevers, chills, night sweats, weight loss, swollen lymph nodes, body aches, joint swelling, muscle aches, chest pain, shortness of breath, mood changes.   Objective Blood pressure 120/78, pulse 69, height 6\' 1"  (1.854 m), weight 208 lb (94.348 kg), SpO2 95 %.  General: No apparent distress alert and oriented x3 mood and affect normal, dressed appropriately.  HEENT: Pupils equal, extraocular movements intact  Respiratory: Patient's speak  in full sentences and does not appear short of breath  Cardiovascular: No lower extremity edema, non tender, no erythema  Skin: Warm dry intact with no signs of infection or rash on extremities or on axial skeleton.  Abdomen: Soft nontender  Neuro: Cranial nerves II through XII are intact, neurovascularly intact in all extremities with 2+ DTRs and 2+ pulses.  Lymph: No lymphadenopathy of posterior or anterior cervical chain or axillae bilaterally.  Gait normal with good balance and coordination.  MSK:  Non tender with full range of motion and good stability and symmetric strength and tone of  elbows, wrist, hip, knee and ankles bilaterally.  Shoulder: left Inspection reveals no abnormalities, atrophy or asymmetry. Palpation is normal with no tenderness over AC joint or bicipital groove. ROM is full in all planes passively. Rotator cuff strength normal throughout. signs of impingement with positive Neer and Hawkin's tests, but negative empty can sign. Speeds and Yergason's tests normal.  labral pathology noted with positive Obrien's, negative clunk and good stability. Normal scapular function observed. No painful arc and no drop arm sign. No apprehension sign  MSK US performed of: left This study was ordered, performed, and interpreted by Charlann Boxer D.O.  Shoulder:   Supraspinatus:  Appears normal on long and transverse views, Bursal bulge seen with shoulder abduction on impingement view. Infraspinatus:  Appears normal on long and transverse views. Significant increase in Doppler flow Subscapularis:  Appears normal on long and transverse views. Positive bursa Teres Minor:  Appears normal on long and transverse views. AC joint:  Capsule undistended, no geyser  sign. Glenohumeral Joint:  Appears normal without effusion. Glenoid Labrum: Patient's labrum does have degenerative changes but no true acute tear noted. Mild increasing hypoechoic in Doppler flow. Biceps Tendon:  Appears normal on  long and transverse views, no fraying of tendon, tendon located in intertubercular groove, no subluxation with shoulder internal or external rotation.  Impression: Subacromial bursitis, with questionable labral tear  Procedure: Real-time Ultrasound Guided Injection of left glenohumeral joint Device: GE Logiq E  Ultrasound guided injection is preferred based studies that show increased duration, increased effect, greater accuracy, decreased procedural pain, increased response rate with ultrasound guided versus blind injection.  Verbal informed consent obtained.  Time-out conducted.  Noted no overlying erythema, induration, or other signs of local infection.  Skin prepped in a sterile fashion.  Local anesthesia: Topical Ethyl chloride.  With sterile technique and under real time ultrasound guidance:  Joint visualized.  23g 1  inch needle inserted posterior approach. Pictures taken for needle placement. Patient did have injection of 2 cc of 1% lidocaine, 2 cc of 0.5% Marcaine, and 1.0 cc of Kenalog 40 mg/dL. Completed without difficulty  Pain immediately resolved suggesting accurate placement of the medication.  Advised to call if fevers/chills, erythema, induration, drainage, or persistent bleeding.  Images permanently stored and available for review in the ultrasound unit.  Impression: Technically successful ultrasound guided injection.   Procedure note 90300; 15 minutes spent for Therapeutic exercises as stated in above notes.  This included exercises focusing on stretching, strengthening, with significant focus on eccentric aspects.   Proper technique shown and discussed handout in great detail with ATC.  All questions were discussed and answered.      Impression and Recommendations:     This case required medical decision making of moderate complexity.

## 2014-02-28 NOTE — Patient Instructions (Addendum)
Very nice to meet you Ice 20 minutes 2 times daily. Usually after activity and before bed. Exercises 3 times a week.  Vitamin D 2000 IU daily Consider turmeric 500mg  twice daily.  pennsaid twice daily Decrease weight to 50% and increase 15% a week.  Nitroglycerin Protocol   Apply 1/4 nitroglycerin patch to affected area daily.  Change position of patch within the affected area every 24 hours.  You may experience a headache during the first 1-2 weeks of using the patch, these should subside.  If you experience headaches after beginning nitroglycerin patch treatment, you may take your preferred over the counter pain reliever.  Another side effect of the nitroglycerin patch is skin irritation or rash related to patch adhesive.  Please notify our office if you develop more severe headaches or rash, and stop the patch.  Tendon healing with nitroglycerin patch may require 12 to 24 weeks depending on the extent of injury.  Men should not use if taking Viagra, Cialis, or Levitra.   Do not use if you have migraines or rosacea.  See me again in 3 weeks.

## 2014-02-28 NOTE — Progress Notes (Signed)
Pre visit review using our clinic review tool, if applicable. No additional management support is needed unless otherwise documented below in the visit note. 

## 2014-03-05 ENCOUNTER — Ambulatory Visit: Payer: PRIVATE HEALTH INSURANCE | Admitting: Family Medicine

## 2014-03-21 ENCOUNTER — Ambulatory Visit: Payer: PRIVATE HEALTH INSURANCE | Admitting: Family Medicine

## 2014-03-23 ENCOUNTER — Ambulatory Visit: Payer: PRIVATE HEALTH INSURANCE | Admitting: Family Medicine

## 2014-03-29 ENCOUNTER — Ambulatory Visit
Admission: RE | Admit: 2014-03-29 | Discharge: 2014-03-29 | Disposition: A | Discharge status: Home / Self Care | Payer: PRIVATE HEALTH INSURANCE | Source: Ambulatory Visit | Attending: Family Medicine | Admitting: Family Medicine

## 2014-03-29 ENCOUNTER — Ambulatory Visit (INDEPENDENT_AMBULATORY_CARE_PROVIDER_SITE_OTHER)
Admission: RE | Admit: 2014-03-29 | Discharge: 2014-03-29 | Disposition: A | Discharge status: Home / Self Care | Payer: PRIVATE HEALTH INSURANCE | Source: Ambulatory Visit | Attending: Family Medicine | Admitting: Family Medicine

## 2014-03-29 ENCOUNTER — Encounter: Payer: Self-pay | Admitting: Family Medicine

## 2014-03-29 ENCOUNTER — Ambulatory Visit (INDEPENDENT_AMBULATORY_CARE_PROVIDER_SITE_OTHER): Payer: PRIVATE HEALTH INSURANCE | Admitting: Family Medicine

## 2014-03-29 VITALS — BP 118/78 | HR 89 | Ht 73.0 in | Wt 204.0 lb

## 2014-03-29 DIAGNOSIS — M25512 Pain in left shoulder: Secondary | ICD-10-CM

## 2014-03-29 DIAGNOSIS — M129 Arthropathy, unspecified: Secondary | ICD-10-CM

## 2014-03-29 DIAGNOSIS — M19019 Primary osteoarthritis, unspecified shoulder: Secondary | ICD-10-CM

## 2014-03-29 DIAGNOSIS — S43432D Superior glenoid labrum lesion of left shoulder, subsequent encounter: Secondary | ICD-10-CM

## 2014-03-29 NOTE — Assessment & Plan Note (Signed)
I do believe the patient does have likely a posterior labral tear. There is a possibility for patient having a SLAP tear but he is still able to do all daily activities and would not want surgical intervention some further workup is likely not necessary. We will get an x-ray to rule out any other bony deformity that could be contributing. We discussed continuing the icing regimen and patient will increase nitroglycerin patches. Patient warned the potential side effects. Patient and will come back and see me again in 3 weeks for further evaluation and treatment.

## 2014-03-29 NOTE — Assessment & Plan Note (Signed)
Patient given ultrasound guided injection today and tolerated the procedure very well. We discussed icing regimen and home exercises. Patient will get x-rays to further evaluate for any other impingement. Patient will come back and see me again in 3 weeks.

## 2014-03-29 NOTE — Progress Notes (Signed)
[name] [name], [name] [address] Phone: [phone] Subjective:    CC: Left shoulder pain follow up  [id number] Anthony [name] is a 34 y.o. male coming in with complaint of left shoulder pain. 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 Patient was seen previously one month ago and diagnosed with a subacromial bursitis as well as a questionable labral tear. Patient was given home exercises, topical anti-inflammatories and patient did have a intra-articular injection. Patient states he is a proximal only 50% better. Patient states that most motions have been improved but unfortunately overhead activity still is difficult. Patient though is able to do bench press now without any significant pain which is an improvement. She denies any new symptoms just has more localization no of the pain which seems to be on the superior aspect of the shoulder as well as somewhat on the posterior aspect of the shoulder. Denies any nighttime awakening. Denies any side effects to the nitroglycerin which she has been religious using.     Past medical history, social, surgical and family history all reviewed in electronic medical record.   Review of Systems: No headache, visual changes, nausea, vomiting, diarrhea, constipation, dizziness, abdominal pain, skin rash, fevers, chills, night sweats, weight loss, swollen lymph nodes, body aches, joint swelling, muscle aches, chest pain, shortness of breath, mood changes.   Objective Blood pressure 118/78, pulse 89, height 6\' 1"  (1.854 m), weight 204 lb (92.534 kg), SpO2 99 %.  General: No apparent distress alert and oriented x3 mood and affect normal, dressed appropriately.  HEENT: Pupils equal, extraocular movements intact  Respiratory: Patient's speak in full sentences and does not appear short of breath  Cardiovascular: No lower extremity edema, non tender, no erythema  Skin:  Warm dry intact with no signs of infection or rash on extremities or on axial skeleton.  Abdomen: Soft nontender  Neuro: Cranial nerves II through XII are intact, neurovascularly intact in all extremities with 2+ DTRs and 2+ pulses.  Lymph: No lymphadenopathy of posterior or anterior cervical chain or axillae bilaterally.  Gait normal with good balance and coordination.  MSK:  Non tender with full range of motion and good stability and symmetric strength and tone of  elbows, wrist, hip, knee and ankles bilaterally.  Shoulder: left Inspection reveals no abnormalities, atrophy or asymmetry. Mild pain over the acromial clavicular joint. ROM is full in all planes passively. Rotator cuff strength normal throughout. Impingement signs significantly Speeds and Yergason's tests normal.  labral pathology noted with positive Obrien's, negative clunk and good stability. Positive crossover with pain over the acromial clavicular joint Normal scapular function observed. No painful arc and no drop arm sign. No apprehension sign  MSK US performed of: left This study was ordered, performed, and interpreted by Charlann Boxer D.O.  Shoulder:   Supraspinatus:  Appears normal on long and transverse views, no bursal bulging noted Infraspinatus:  Appears normal on long and transverse views. Significant increase in Doppler flow Subscapularis:  Appears normal on long and transverse views. Positive bursa Teres Minor:  Appears normal on long and transverse views. AC joint:  Capsule was distended with mild to moderate osteophytic changes Glenohumeral Joint:  Appears normal without effusion. Glenoid Labrum: Patient's labrum does have degenerative changes but no true acute tear noted. Mild increasing hypoechoic in Doppler flow. Biceps Tendon:  Appears normal on long and transverse views, no fraying of tendon, tendon located in intertubercular  groove, no subluxation with shoulder internal or external  rotation.  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  Verbal informed consent obtained.  Time-out conducted.  Noted no overlying erythema, induration, or other signs of local infection.  Skin prepped in a sterile fashion.  Local anesthesia: Topical Ethyl chloride.  With sterile technique and under real time ultrasound guidance:  Joint visualized.  25g 1 inch needle inserted superior approach. Pictures taken for needle placement. Patient did have injection of  2 cc of 0.5% Marcaine, and 1.0 cc of Kenalog 40 mg/dL. Completed without difficulty  Pain immediately resolved suggesting accurate placement of the medication.  Advised to call if fevers/chills, erythema, induration, drainage, or persistent bleeding.  Images permanently stored and available for review in the ultrasound unit.  Impression: Technically successful ultrasound guided injection.       Impression and Recommendations:     This case required medical decision making of moderate complexity.

## 2014-03-29 NOTE — Progress Notes (Signed)
Pre visit review using our clinic review tool, if applicable. No additional management support is needed unless otherwise documented below in the visit note. 

## 2014-03-29 NOTE — Patient Instructions (Signed)
Good to see you Drop weight 20% for now and increase 10% a week.  Nitro up to 1/2 patch and put on back of shoulder.  Ice still is good.  Avoid overhead activities for 2 weeks Xrays downstairs See me again in 3 weeks.

## 2014-04-25 ENCOUNTER — Ambulatory Visit: Payer: PRIVATE HEALTH INSURANCE | Admitting: Family Medicine

## 2014-06-18 ENCOUNTER — Encounter: Payer: Self-pay | Admitting: Internal Medicine

## 2014-06-18 ENCOUNTER — Ambulatory Visit (INDEPENDENT_AMBULATORY_CARE_PROVIDER_SITE_OTHER): Payer: PRIVATE HEALTH INSURANCE | Admitting: Internal Medicine

## 2014-06-18 VITALS — BP 120/84 | HR 86 | Temp 98.5°F | Resp 14 | Ht 73.0 in | Wt 221.5 lb

## 2014-06-18 DIAGNOSIS — T148 Other injury of unspecified body region: Secondary | ICD-10-CM | POA: Diagnosis not present

## 2014-06-18 DIAGNOSIS — R748 Abnormal levels of other serum enzymes: Secondary | ICD-10-CM | POA: Diagnosis not present

## 2014-06-18 DIAGNOSIS — W57XXXA Bitten or stung by nonvenomous insect and other nonvenomous arthropods, initial encounter: Secondary | ICD-10-CM | POA: Diagnosis not present

## 2014-06-18 MED ORDER — DOXYCYCLINE HYCLATE 100 MG PO CAPS: 100.0000 mg | ORAL_CAPSULE | Freq: Two times a day (BID) | ORAL | Status: DC

## 2014-06-18 MED ORDER — PANTOPRAZOLE SODIUM 40 MG PO TBEC: 40.0000 mg | DELAYED_RELEASE_TABLET | Freq: Every day | ORAL | Status: DC

## 2014-06-18 NOTE — Progress Notes (Signed)
Patient ID: Anthony [name], male   DOB: 16-Apr-1980, 34 y.o.   MRN: [id number]

## 2014-06-18 NOTE — Progress Notes (Signed)
Pre-visit discussion using our clinic review tool. No additional management support is needed unless otherwise documented below in the visit note.  

## 2014-06-18 NOTE — Patient Instructions (Addendum)
I am treating you for a tick borne illness with doxycycline for a week, just to be on the safe side,  Since you have had tick exposure  Please take a probiotic ( Align, Flora que or Culturelle or the store brand equivalent ) for 3 weeks  to prevent a serious antibiotic associated diarrhea  Called" clostridium difficile colitis"   I am also starting you on a medication for your stomach called Pantoprazole (protonix) which treats gastritis and ulcers   If your pain gets worse,  I need to know so I can order imaging

## 2014-06-19 LAB — COMPREHENSIVE METABOLIC PANEL
ALT: 60 U/L — ABNORMAL HIGH (ref 0–53)
AST: 34 U/L (ref 0–37)
Albumin: 4.6 g/dL (ref 3.5–5.2)
Alkaline Phosphatase: 68 U/L (ref 39–117)
BILIRUBIN TOTAL: 0.9 mg/dL (ref 0.2–1.2)
BUN: 30 mg/dL — AB (ref 6–23)
CO2: 32 mEq/L (ref 19–32)
Calcium: 10.2 mg/dL (ref 8.4–10.5)
Chloride: 99 mEq/L (ref 96–112)
Creatinine, Ser: 1.27 mg/dL (ref 0.40–1.50)
GFR: 68.29 mL/min (ref >60.00)
Glucose, Bld: 85 mg/dL (ref 70–99)
Potassium: 5 mEq/L (ref 3.5–5.1)
SODIUM: 135 meq/L (ref 135–145)
TOTAL PROTEIN: 7.1 g/dL (ref 6.0–8.3)

## 2014-06-19 LAB — CBC WITH DIFFERENTIAL/PLATELET
BASOS ABS: 0.1 10*3/uL (ref 0.0–0.1)
Basophils Relative: 0.7 % (ref 0.0–3.0)
EOS ABS: 0.2 10*3/uL (ref 0.0–0.7)
Eosinophils Relative: 2.2 % (ref 0.0–5.0)
HEMATOCRIT: 50.7 % (ref 39.0–52.0)
Hemoglobin: 17.1 g/dL — ABNORMAL HIGH (ref 13.0–17.0)
Lymphocytes Relative: 26.2 % (ref 12.0–46.0)
Lymphs Abs: 1.8 10*3/uL (ref 0.7–4.0)
MCHC: 33.7 g/dL (ref 30.0–36.0)
MCV: 89.2 fl (ref 78.0–100.0)
MONOS PCT: 15.7 % — AB (ref 3.0–12.0)
Monocytes Absolute: 1.1 10*3/uL — ABNORMAL HIGH (ref 0.1–1.0)
Neutro Abs: 3.8 10*3/uL (ref 1.4–7.7)
Neutrophils Relative %: 55.2 % (ref 43.0–77.0)
PLATELETS: 153 10*3/uL (ref 150.0–400.0)
RBC: 5.68 Mil/uL (ref 4.22–5.81)
RDW: 12.6 % (ref 11.5–15.5)
WBC: 6.9 10*3/uL (ref 4.0–10.5)

## 2014-06-19 LAB — SEDIMENTATION RATE: Sed Rate: 0 mm/hr (ref 0–22)

## 2014-06-19 LAB — C-REACTIVE PROTEIN: CRP: 1 mg/dL (ref 0.5–20.0)

## 2014-06-20 ENCOUNTER — Other Ambulatory Visit: Payer: Self-pay | Admitting: Internal Medicine

## 2014-06-20 DIAGNOSIS — R748 Abnormal levels of other serum enzymes: Secondary | ICD-10-CM

## 2014-06-20 LAB — EHRLICHIA ANTIBODY PANEL: E chaffeensis (HGE) Ab, IgG: <1:64 {titer}

## 2014-06-20 LAB — ROCKY MTN SPOTTED FVR ABS PNL(IGG+IGM)
RMSF IGG: 1.18 IV — AB
RMSF IgM: 0.14 IV

## 2014-06-20 NOTE — Assessment & Plan Note (Addendum)
Hepatitis Suggested by history and elevate liver enzyme .  Will have him return for repeat labs and ultrasound..  Empiric treatment for tick borne illness given exposure.

## 2014-06-20 NOTE — Progress Notes (Signed)
Subjective:  Patient ID: Anthony [name], male    DOB: 05/05/1980  Age: 34 y.o. MRN: [id number]  CC: The primary encounter diagnosis was Tick bite. A diagnosis of Elevated liver enzymes was also pertinent to this visit.  HPI [name] presents for evaluation of abdominal pain. History:3 weeks ago had abd pain with emesis x 2.  abd pain has recurred as a dull postprandial pain without emesis and has been taking zantac daily..  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 nitroGLYCERIN (NITRODUR - DOSED IN MG/24 HR) 0.2 mg/hr patch 1/4 patch daily 30 patch 1   No facility-administered medications prior to visit.    ROS Review of Systems  Constitutional: Positive for fever, chills and fatigue.  HENT: Negative.  Negative for facial swelling, rhinorrhea and trouble swallowing.   Eyes: Negative.  Negative for discharge and visual disturbance.  Respiratory: Negative for cough and shortness of breath.   Cardiovascular: Negative for chest pain and leg swelling.  Gastrointestinal: Positive for nausea, vomiting and abdominal pain. Negative for diarrhea, constipation and anal bleeding.  Endocrine: Negative.   Genitourinary: Negative.   Musculoskeletal: Negative.   Skin: Negative.   Allergic/Immunologic: Negative for environmental allergies.  Neurological: Negative for dizziness, light-headedness and headaches.  Hematological: Negative.   Psychiatric/Behavioral: Negative for decreased concentration.        Objective:  BP 120/84 mmHg  Pulse 86  Temp(Src) 98.5 F (36.9 C) (Oral)  Resp 14  Ht 6\' 1"  (1.854 m)  Wt 221 lb 8 oz (100.472 kg)  BMI 29.23 kg/m2  SpO2 98%  BP Readings from Last 3 Encounters:  06/18/14 120/84  03/29/14 118/78  02/28/14 120/78    Wt Readings from Last 3 Encounters:  06/18/14 221  lb 8 oz (100.472 kg)  03/29/14 204 lb (92.534 kg)  02/28/14 208 lb (94.348 kg)    Physical Exam  General appearance: alert, cooperative and appears stated age Ears: normal TM's and external ear canals both ears Throat: lips, mucosa, and tongue normal; teeth and gums normal Neck: no adenopathy, no carotid bruit, supple, symmetrical, trachea midline and thyroid not enlarged, symmetric, no tenderness/mass/nodules Back: symmetric, no curvature. ROM normal. No CVA tenderness. Lungs: clear to auscultation bilaterally Heart: regular rate and rhythm, S1, S2 normal, no murmur, click, rub or gallop Abdomen: soft, non-tender; bowel sounds normal; no masses,  no organomegaly Pulses: 2+ and symmetric Skin: Skin color, texture, turgor normal. No rashes or lesions Lymph nodes: Cervical, supraclavicular, and axillary nodes normal  . No results found for: HGBA1C  Lab Results  Component Value Date   CREATININE 1.27 06/18/2014   CREATININE 1.03 12/08/2013   CREATININE 1.0 11/16/2012    Lab Results  Component Value Date   WBC 6.9 06/18/2014   HGB 17.1* 06/18/2014   HCT 50.7 06/18/2014   PLT 153.0 06/18/2014   GLUCOSE 85 06/18/2014   CHOL 187 12/08/2013   TRIG 85 12/08/2013   HDL 55 12/08/2013   LDLCALC 115* 12/08/2013   ALT 60* 06/18/2014   AST 34 06/18/2014   NA 135 06/18/2014   K 5.0 06/18/2014   CL 99 06/18/2014   CREATININE 1.27 06/18/2014   BUN 30* 06/18/2014   CO2 32 06/18/2014   TSH 2.239 12/08/2013    Dg Cervical Spine Complete  03/29/2014   CLINICAL DATA:  Five year  history of neck pain with radicular symptoms into left shoulder region  EXAM: CERVICAL SPINE  4+ VIEWS  COMPARISON:  None.  FINDINGS: Frontal, lateral, open-mouth odontoid, and bilateral oblique views were obtained. There is no fracture. There is minimal retrolisthesis of C5 on C6, felt to be due to underlying spondylosis. There is no other spondylolisthesis. Prevertebral soft tissues and predental space regions  are normal there is moderate disc space narrowing at C5-6. There is slight disc space narrowing at C4-5. Other disc spaces appear normal. There is exit foraminal narrowing at C3-4, C4-5, and C5-6 bilaterally on the oblique views.  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 Slight spondylolisthesis at C5-6 is felt to be due to underlying spondylosis. No other spondylolisthesis. No fracture.   Electronically Signed   By: Lowella Grip III M.D.   On: 03/29/2014 13:56    Assessment & Plan:   Problem List Items Addressed This Visit    Elevated liver enzymes    Hepatitis Suggested by history and elevate liver enzyme .  Will have him return for repeat labs and ultrasound..  Empiric treatment for tick borne illness given exposure.        Other Visit Diagnoses    Tick bite    -  Primary    Relevant Orders    Sedimentation rate (Completed)    C-reactive protein (Completed)    Comprehensive metabolic panel (Completed)    CBC with Differential/Platelet (Completed)    Lyme Aby, Wstrn. Blt. IgG & IgM w/bands (Completed)    Rocky mtn spotted fvr abs pnl(IgG+IgM) (Completed)    Ehrlichia antibody panel (Completed)       I am having Mr. Pustejovsky start on pantoprazole and doxycycline. I am also having him maintain his nitroGLYCERIN and ranitidine.  Meds ordered this encounter  Medications  . ranitidine (ZANTAC) 75 MG tablet    Sig: Take 75 mg by mouth 2 (two) times daily.  . pantoprazole (PROTONIX) 40 MG tablet    Sig: Take 1 tablet (40 mg total) by mouth daily.    Dispense:  30 tablet    Refill:  2  . doxycycline (VIBRAMYCIN) 100 MG capsule    Sig: Take 1 capsule (100 mg total) by mouth 2 (two) times daily.    Dispense:  14 capsule    Refill:  0    There are no discontinued medications.  Follow-up: No Follow-up on file.   [name], MD

## 2014-06-21 LAB — LYME ABY, WSTRN BLT IGG & IGM W/BANDS
B BURGDORFERI IGG ABS (IB): NEGATIVE
B BURGDORFERI IGM ABS (IB): NEGATIVE
LYME DISEASE 30 KD IGG: NONREACTIVE
LYME DISEASE 39 KD IGG: NONREACTIVE
LYME DISEASE 39 KD IGM: NONREACTIVE
LYME DISEASE 58 KD IGG: NONREACTIVE
LYME DISEASE 66 KD IGG: NONREACTIVE
Lyme Disease 18 kD IgG: NONREACTIVE
Lyme Disease 23 kD IgG: NONREACTIVE
Lyme Disease 23 kD IgM: NONREACTIVE
Lyme Disease 28 kD IgG: REACTIVE — AB
Lyme Disease 41 kD IgG: NONREACTIVE
Lyme Disease 41 kD IgM: NONREACTIVE
Lyme Disease 45 kD IgG: NONREACTIVE
Lyme Disease 93 kD IgG: NONREACTIVE

## 2014-06-22 ENCOUNTER — Encounter: Payer: Self-pay | Admitting: Internal Medicine

## 2014-06-22 DIAGNOSIS — R748 Abnormal levels of other serum enzymes: Secondary | ICD-10-CM

## 2014-06-22 DIAGNOSIS — G8929 Other chronic pain: Secondary | ICD-10-CM

## 2014-06-22 DIAGNOSIS — R1013 Epigastric pain: Secondary | ICD-10-CM

## 2014-06-25 ENCOUNTER — Ambulatory Visit: Payer: PRIVATE HEALTH INSURANCE

## 2014-06-26 ENCOUNTER — Other Ambulatory Visit: Payer: Self-pay | Admitting: Internal Medicine

## 2014-07-04 ENCOUNTER — Ambulatory Visit
Admission: RE | Admit: 2014-07-04 | Discharge: 2014-07-04 | Disposition: A | Discharge status: Home / Self Care | Payer: PRIVATE HEALTH INSURANCE | Source: Ambulatory Visit | Attending: Internal Medicine | Admitting: Internal Medicine

## 2014-07-04 DIAGNOSIS — R1013 Epigastric pain: Secondary | ICD-10-CM | POA: Insufficient documentation

## 2014-07-04 DIAGNOSIS — R748 Abnormal levels of other serum enzymes: Secondary | ICD-10-CM

## 2014-07-04 DIAGNOSIS — G8929 Other chronic pain: Secondary | ICD-10-CM

## 2014-07-04 MED ORDER — IOHEXOL 300 MG/ML  SOLN: 100.0000 mL | Freq: Once | INTRAMUSCULAR | Status: DC | PRN

## 2014-07-04 MED ORDER — IOHEXOL 350 MG/ML SOLN
100.0000 mL | Freq: Once | INTRAVENOUS | Status: AC | PRN
  Administered 2014-07-04: 100 mL via INTRAVENOUS

## 2014-07-05 ENCOUNTER — Ambulatory Visit: Admission: RE | Admit: 2014-07-05 | Payer: PRIVATE HEALTH INSURANCE | Source: Ambulatory Visit

## 2014-07-06 ENCOUNTER — Encounter: Payer: Self-pay | Admitting: Internal Medicine

## 2015-06-04 ENCOUNTER — Encounter: Payer: Self-pay | Admitting: Family Medicine

## 2015-06-04 ENCOUNTER — Ambulatory Visit (INDEPENDENT_AMBULATORY_CARE_PROVIDER_SITE_OTHER): Payer: Managed Care, Other (non HMO) | Admitting: Family Medicine

## 2015-06-04 ENCOUNTER — Other Ambulatory Visit: Payer: Self-pay

## 2015-06-04 ENCOUNTER — Ambulatory Visit (INDEPENDENT_AMBULATORY_CARE_PROVIDER_SITE_OTHER)
Admission: RE | Admit: 2015-06-04 | Discharge: 2015-06-04 | Disposition: A | Discharge status: Home / Self Care | Payer: Managed Care, Other (non HMO) | Source: Ambulatory Visit | Attending: Family Medicine | Admitting: Family Medicine

## 2015-06-04 VITALS — BP 114/78 | HR 80 | Ht 73.0 in | Wt 197.0 lb

## 2015-06-04 DIAGNOSIS — M25511 Pain in right shoulder: Secondary | ICD-10-CM

## 2015-06-04 DIAGNOSIS — M75101 Unspecified rotator cuff tear or rupture of right shoulder, not specified as traumatic: Secondary | ICD-10-CM

## 2015-06-04 MED ORDER — PREDNISONE 50 MG PO TABS: 50.0000 mg | ORAL_TABLET | Freq: Every day | ORAL | Status: DC

## 2015-06-04 NOTE — Assessment & Plan Note (Signed)
Given injection today  DDx includes labral tear and lattismus tear.  Concern with amount of internal rotation. Discussed, ice, nitro, prednsone as well. Patient will try this conservative therapy and we will see him back again in 3-4 weeks. X-rays ordered for any other bony undermining the could contribute in. Patient continues to have difficulty secondary to the amount weakness and patient being very active and do think that advance imaging could be warranted.

## 2015-06-04 NOTE — Progress Notes (Signed)
[name] [address], [place] [address] Phone: [phone] Subjective:    CC: Right shoulder pain  [id number] Anthony [name] is a 35 y.o. male coming in with complaint of right shoulder pain. Patient was seen last year for left-sided shoulder pain. States that the pain is a little different and significant worse than the contralateral side was. States that he was initially squatting and when he was done and felt a pain on the posterior aspect of the shoulder. Within a day he had so much pain he was significant he week. States that most of it seemed to be more on the posterior aspect of the shoulder and radiating towards his triceps. Denies any neck pain associated with it. States that this be going on now for approximate 9 months. Possibly worsening slowly. Can wake him up at night. Not responding over-the-counter medications. Rates the severity of pain a 7 out of 10.    No past medical history on file. No past surgical history on file. Social History  Substance Use Topics  . Smoking status: Never Smoker   . Smokeless tobacco: Never Used  . Alcohol Use: 6.0 oz/week    10 Cans of beer per week   Allergies  Allergen Reactions  . Erythromycin    Family History  Problem Relation Age of Onset  . Cancer Paternal Uncle 64    prostate ca  . Heart disease Paternal Uncle   . Hearing loss Paternal Uncle   . Cancer Paternal Grandfather     aplastic anemia?   Marland Kitchen Arthritis Paternal Grandfather     temporal arteritis     Past medical history, social, surgical and family history all reviewed in electronic medical record.   Review of Systems: No headache, visual changes, nausea, vomiting, diarrhea, constipation, dizziness, abdominal pain, skin rash, fevers, chills, night sweats, weight loss, swollen lymph nodes, body aches, joint swelling, muscle aches, chest pain, shortness of breath, mood changes.   Objective Blood pressure 114/78, pulse 80,  height 6\' 1"  (1.854 m), weight 197 lb (89.359 kg), SpO2 98 %.  General: No apparent distress alert and oriented x3 mood and affect normal, dressed appropriately.  HEENT: Pupils equal, extraocular movements intact  Respiratory: Patient's speak in full sentences and does not appear short of breath  Cardiovascular: No lower extremity edema, non tender, no erythema  Skin: Warm dry intact with no signs of infection or rash on extremities or on axial skeleton.  Abdomen: Soft nontender  Neuro: Cranial nerves II through XII are intact, neurovascularly intact in all extremities with 2+ DTRs and 2+ pulses.  Lymph: No lymphadenopathy of posterior or anterior cervical chain or axillae bilaterally.  Gait normal with good balance and coordination.  MSK:  Non tender with full range of motion and good stability and symmetric strength and tone of  elbows, wrist, hip, knee and ankles bilaterally.  Shoulder: right  Inspection reveals no abnormalities, atrophy or asymmetry. TTP over the posterior shoulder  ROM is full in all planes passively. Increase internal Range of motion compared ton contralaterlal side.  Rotator cuff strength 4/5 compared to contralateral side.  Impingement signs significantly Speeds and Yergason's tests normal.  labral pathology noted with positive Obrien's, negative clunk and good stability. Positive crossover with pain over the acromial clavicular joint Normal scapular function observed. No painful arc and no drop arm sign. Positive apprehension Contralateral side minimal discomfort and minimal impingement noted  MSK US performed of: left This study was ordered,  performed, and interpreted by Charlann Boxer D.O.  Shoulder:   Supraspinatus: Partial thickness articular side tear about 15% with no retracton.   Infraspinatus:  Appears normal on long and transverse views. Significant increase in Doppler flow Subscapularis:  Large partial-thickness tear noted of approximately 75% of the  tendon noted. Hypoechoic changes and some mild degenerative changes noted. Atrophy compared to the contralateral side. Teres Minor:  Appears normal on long and transverse views. AC joint:  Capsule was distended with mild to moderate osteophytic changes Glenohumeral Joint:  Appears normal without effusion. Glenoid Labrum: degenerative changes noted with swelling above her more superficial within the labrum itself. Biceps Tendon:  Appears normal on long and transverse views, no fraying of tendon, tendon located in intertubercular groove, no subluxation with shoulder internal or external rotation.  Impression: Right rotator cuff tear partial thickness no retraction  Procedure: Real-time Ultrasound Guided Injection of right glenohumeral joint Device: GE Logiq E  Ultrasound guided injection is preferred based studies that show increased duration, increased effect, greater accuracy, decreased procedural pain, increased response rate with ultrasound guided versus blind injection.  Verbal informed consent obtained.  Time-out conducted.  Noted no overlying erythema, induration, or other signs of local infection.  Skin prepped in a sterile fashion.  Local anesthesia: Topical Ethyl chloride.  With sterile technique and under real time ultrasound guidance:  Joint visualized.  23g 1  inch needle inserted posterior approach. Pictures taken for needle placement. Patient did have injection of 2 cc of 1% lidocaine, 2 cc of 0.5% Marcaine, and 1.0 cc of Kenalog 40 mg/dL. Completed without difficulty  Pain immediately resolved suggesting accurate placement of the medication.  Advised to call if fevers/chills, erythema, induration, drainage, or persistent bleeding.  Images permanently stored and available for review in the ultrasound unit.  Impression: Technically successful ultrasound guided injection.       Impression and Recommendations:     This case required medical decision making of moderate  complexity.

## 2015-06-04 NOTE — Progress Notes (Signed)
Pre visit review using our clinic review tool, if applicable. No additional management support is needed unless otherwise documented below in the visit note. 

## 2015-06-04 NOTE — Patient Instructions (Signed)
God to see you  You do have a tear noted.  I did inject the shoulder today  Xrays downstairs today  Prednisone daily for 5 days  Ice 20 minutes 2 times daily. Usually after activity and before bed. Lay off any upper body for next 10-14 days until I see you again .

## 2015-06-17 ENCOUNTER — Encounter: Payer: Self-pay | Admitting: Family Medicine

## 2015-06-17 ENCOUNTER — Ambulatory Visit (INDEPENDENT_AMBULATORY_CARE_PROVIDER_SITE_OTHER): Payer: Managed Care, Other (non HMO) | Admitting: Family Medicine

## 2015-06-17 VITALS — BP 120/82 | HR 90 | Ht 73.0 in | Wt 196.0 lb

## 2015-06-17 DIAGNOSIS — S43432D Superior glenoid labrum lesion of left shoulder, subsequent encounter: Secondary | ICD-10-CM | POA: Diagnosis not present

## 2015-06-17 DIAGNOSIS — M19019 Primary osteoarthritis, unspecified shoulder: Secondary | ICD-10-CM

## 2015-06-17 DIAGNOSIS — M129 Arthropathy, unspecified: Secondary | ICD-10-CM | POA: Diagnosis not present

## 2015-06-17 DIAGNOSIS — M75101 Unspecified rotator cuff tear or rupture of right shoulder, not specified as traumatic: Secondary | ICD-10-CM | POA: Diagnosis not present

## 2015-06-17 MED ORDER — NITROGLYCERIN 0.2 MG/HR TD PT24: MEDICATED_PATCH | TRANSDERMAL | Status: DC

## 2015-06-17 NOTE — Progress Notes (Signed)
Pre visit review using our clinic review tool, if applicable. No additional management support is needed unless otherwise documented below in the visit note. 

## 2015-06-17 NOTE — Assessment & Plan Note (Signed)
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 Can consider injection if necessary.

## 2015-06-17 NOTE — Assessment & Plan Note (Signed)
Appears to be making some improvement at this time. We discussed icing regimen and home exercises. We discussed if continuing have pain we'll consider injection in the acromioclavicular joint. Patient declined formal physical therapy or advance imaging at this time. I do believe the patient does have a labral tear. I would like to ultrasound again in 4 weeks. Continue nitroglycerin glycerin. Follow-up in 4 weeks.

## 2015-06-17 NOTE — Progress Notes (Signed)
  [name] [name], [place] [address] Phone: [phone] Subjective:    CC: Right shoulder pain f/u  [id number] Anthony [name] is a 35 y.o. male coming in with complaint of right shoulder pain. Found to have rotator cuff tear and likely labral tear.  Had injections, doing better about 25% No night pain, overall though still not feeling strong overall.  Attempted to try to do some the exercises but not regularly.    No past medical history on file. No past surgical history on file. Social History  Substance Use Topics  . Smoking status: Never Smoker   . Smokeless tobacco: Never Used  . Alcohol Use: 6.0 oz/week    10 Cans of beer per week   Allergies  Allergen Reactions  . Erythromycin    Family History  Problem Relation Age of Onset  . Cancer Paternal Uncle 71    prostate ca  . Heart disease Paternal Uncle   . Hearing loss Paternal Uncle   . Cancer Paternal Grandfather     aplastic anemia?   Marland Kitchen Arthritis Paternal Grandfather     temporal arteritis     Past medical history, social, surgical and family history all reviewed in electronic medical record.   Review of Systems: No headache, visual changes, nausea, vomiting, diarrhea, constipation, dizziness, abdominal pain, skin rash, fevers, chills, night sweats, weight loss, swollen lymph nodes, body aches, joint swelling, muscle aches, chest pain, shortness of breath, mood changes.   Objective Blood pressure 120/82, pulse 90, height 6\' 1"  (1.854 m), weight 196 lb (88.905 kg), SpO2 98 %.  General: No apparent distress alert and oriented x3 mood and affect normal, dressed appropriately.  HEENT: Pupils equal, extraocular movements intact  Respiratory: Patient's speak in full sentences and does not appear short of breath  Cardiovascular: No lower extremity edema, non tender, no erythema  Skin: Warm dry intact with no signs of infection or rash on extremities or on axial skeleton.    Abdomen: Soft nontender  Neuro: Cranial nerves II through XII are intact, neurovascularly intact in all extremities with 2+ DTRs and 2+ pulses.  Lymph: No lymphadenopathy of posterior or anterior cervical chain or axillae bilaterally.  Gait normal with good balance and coordination.  MSK:  Non tender with full range of motion and good stability and symmetric strength and tone of  elbows, wrist, hip, knee and ankles bilaterally.  Shoulder: right  Inspection reveals no abnormalities, atrophy or asymmetry. TTP over the posterior shoulder  ROM is full in all planes passively. Increase internal Range of motion compared ton contralaterlal side.  Rotator cuff strength improved from previous exam with near 5 out of 5 strength Speeds and Yergason's tests normal.  labral pathology noted with positive Obrien's, negative clunk and good stability.  Positive crossover with pain over the acromial clavicular joint still present Normal scapular function observed. No painful arc and no drop arm sign. Negative apprehension Contralateral side minimal discomfort and minimal impingement noted       Impression and Recommendations:     This case required medical decision making of moderate complexity.

## 2015-06-17 NOTE — Assessment & Plan Note (Signed)
We'll continue to monitor. With patient slowly increasing his exercise prescription if worsening symptoms we will need to consider further imaging.

## 2015-06-17 NOTE — Patient Instructions (Signed)
Great to see you  You are making progress Start lifting again at 50% and increase slowly 10% a week.  Keep hands within peripheral vision.  Continue the nitro patch.  If in pain 3 ibuprofen 3 times a day for 3 days.  See me again in 4 weeks.  Ice after activity

## 2015-07-12 ENCOUNTER — Ambulatory Visit (INDEPENDENT_AMBULATORY_CARE_PROVIDER_SITE_OTHER): Payer: Managed Care, Other (non HMO) | Admitting: Family Medicine

## 2015-07-12 ENCOUNTER — Encounter: Payer: Self-pay | Admitting: Family Medicine

## 2015-07-12 VITALS — BP 110/82 | HR 74 | Temp 98.5°F | Ht 73.0 in | Wt 202.5 lb

## 2015-07-12 DIAGNOSIS — L03116 Cellulitis of left lower limb: Secondary | ICD-10-CM

## 2015-07-12 MED ORDER — DOXYCYCLINE HYCLATE 100 MG PO CAPS: 100.0000 mg | ORAL_CAPSULE | Freq: Two times a day (BID) | ORAL | Status: DC

## 2015-07-12 NOTE — Patient Instructions (Signed)
Take the doxycycline.  Follow up if you fail to improve or worsen.  Take care  Dr. Lacinda Axon

## 2015-07-12 NOTE — Assessment & Plan Note (Signed)
New problem. Treating with Doxy. Advised to go to the ED if he worsens.

## 2015-07-12 NOTE — Progress Notes (Signed)
Pre visit review using our clinic review tool, if applicable. No additional management support is needed unless otherwise documented below in the visit note. 

## 2015-07-12 NOTE — Progress Notes (Signed)
   Subjective:  Patient ID: Anthony [name], male    DOB: 1980-02-08  Age: 35 y.o. MRN: [id number]  CC: Knee redness/? injury  HPI:  35 year old male presents with the above complaint.  Patient reports a 2 day history of pain, redness, warmth of the left knee. He reports a central open wound (small). He does not recall any trauma/injury. Pain is mild to moderate in severity. No associated fevers or chills. No exacerbating or relieving factors. No other complaints this time.  Social Hx   Social History   Social History  . Marital Status: Married    Spouse Name: N/A  . Number of Children: N/A  . Years of Education: N/A   Social History Main Topics  . Smoking status: Never Smoker   . Smokeless tobacco: Never Used  . Alcohol Use: 6.0 oz/week    10 Cans of beer per week  . Drug Use: No  . Sexual Activity: Yes   Other Topics Concern  . None   Social History Narrative   Review of Systems  Constitutional: Negative.   Musculoskeletal:       Left knee redness/warmth/swelling.    Objective:  BP 110/82 mmHg  Pulse 74  Temp(Src) 98.5 F (36.9 C) (Oral)  Ht 6\' 1"  (1.854 m)  Wt 202 lb 8 oz (91.853 kg)  BMI 26.72 kg/m2  SpO2 97%  BP/Weight 07/12/2015 [id number]  Systolic BP [id number]  Diastolic BP 82 82 78  Wt. (Lbs) 202.5 196 197  BMI 26.72 25.86 26    Physical Exam  Constitutional: He is oriented to person, place, and time. He appears well-developed. No distress.  Cardiovascular: Normal rate and regular rhythm.   Musculoskeletal:  Knee - left; normal ROM. Mild swelling.  Neurological: He is alert and oriented to person, place, and time.  Skin:  Left knee - small open lesion noted anteriorly. Surrounding erythema and warmth.  Psychiatric: He has a normal mood and affect.  Vitals reviewed.   Lab Results  Component Value Date   WBC 6.9 06/18/2014   HGB 17.1* 06/18/2014   HCT 50.7 06/18/2014   PLT 153.0 06/18/2014   GLUCOSE 85 06/18/2014   CHOL 187  12/08/2013   TRIG 85 12/08/2013   HDL 55 12/08/2013   LDLCALC 115* 12/08/2013   ALT 60* 06/18/2014   AST 34 06/18/2014   NA 135 06/18/2014   K 5.0 06/18/2014   CL 99 06/18/2014   CREATININE 1.27 06/18/2014   BUN 30* 06/18/2014   CO2 32 06/18/2014   TSH 2.239 12/08/2013    Assessment & Plan:   Problem List Items Addressed This Visit    Cellulitis of knee, left - Primary    New problem. Treating with Doxy. Advised to go to the ED if he worsens.         Meds ordered this encounter  Medications  . doxycycline (VIBRAMYCIN) 100 MG capsule    Sig: Take 1 capsule (100 mg total) by mouth 2 (two) times daily.    Dispense:  20 capsule    Refill:  0   Follow-up: PRN  [facility]

## 2015-07-15 ENCOUNTER — Ambulatory Visit: Payer: Managed Care, Other (non HMO) | Admitting: Family Medicine

## 2015-07-19 ENCOUNTER — Ambulatory Visit: Payer: Managed Care, Other (non HMO) | Admitting: Family Medicine

## 2015-07-23 ENCOUNTER — Other Ambulatory Visit: Payer: Self-pay

## 2015-07-23 ENCOUNTER — Ambulatory Visit (INDEPENDENT_AMBULATORY_CARE_PROVIDER_SITE_OTHER): Payer: Managed Care, Other (non HMO) | Admitting: Family Medicine

## 2015-07-23 ENCOUNTER — Encounter: Payer: Self-pay | Admitting: Family Medicine

## 2015-07-23 VITALS — BP 116/78 | HR 76 | Wt 200.0 lb

## 2015-07-23 DIAGNOSIS — M75101 Unspecified rotator cuff tear or rupture of right shoulder, not specified as traumatic: Secondary | ICD-10-CM | POA: Diagnosis not present

## 2015-07-23 NOTE — Progress Notes (Signed)
[name] [address] [place], [place] [address] Phone: [phone] Subjective:    CC: Right shoulder pain f/u  [id number] Anthony [name] is a 35 y.o. male coming in with complaint of right shoulder pain. Found to have rotator cuff tear and likely labral tear.  Had injections, doing better about 60% Seems to be increasing strength. Happy with the results of far. No nighttime problems. Patient stopped the nitroglycerin. Feels like he did not need it anymore and was causing headaches. Denies any radiation down the arm. Overall feeling much better..    No past medical history on file. No past surgical history on file. Social History  Substance Use Topics  . Smoking status: Never Smoker   . Smokeless tobacco: Never Used  . Alcohol Use: 6.0 oz/week    10 Cans of beer per week   Allergies  Allergen Reactions  . Erythromycin    Family History  Problem Relation Age of Onset  . Cancer Paternal Uncle 16    prostate ca  . Heart disease Paternal Uncle   . Hearing loss Paternal Uncle   . Cancer Paternal Grandfather     aplastic anemia?   Marland Kitchen Arthritis Paternal Grandfather     temporal arteritis     Past medical history, social, surgical and family history all reviewed in electronic medical record.   Review of Systems: No headache, visual changes, nausea, vomiting, diarrhea, constipation, dizziness, abdominal pain, skin rash, fevers, chills, night sweats, weight loss, swollen lymph nodes, body aches, joint swelling, muscle aches, chest pain, shortness of breath, mood changes.   Objective Blood pressure 116/78, pulse 76, weight 200 lb (90.719 kg).  General: No apparent distress alert and oriented x3 mood and affect normal, dressed appropriately.  HEENT: Pupils equal, extraocular movements intact  Respiratory: Patient's speak in full sentences and does not appear short of breath  Cardiovascular: No lower extremity edema, non tender, no erythema  Skin:  Warm dry intact with no signs of infection or rash on extremities or on axial skeleton.  Abdomen: Soft nontender  Neuro: Cranial nerves II through XII are intact, neurovascularly intact in all extremities with 2+ DTRs and 2+ pulses.  Lymph: No lymphadenopathy of posterior or anterior cervical chain or axillae bilaterally.  Gait normal with good balance and coordination.  MSK:  Non tender with full range of motion and good stability and symmetric strength and tone of  elbows, wrist, hip, knee and ankles bilaterally.  Shoulder: right  Inspection reveals no abnormalities, atrophy or asymmetry. TTP over the posterior shoulder  ROM is full in all planes passively. Increase internal Range of motion compared ton contralaterlal side.  Talar cuff strength full Speeds and Yergason's tests normal.  labral pathology noted with positive Obrien's, negative clunk and good stability.  Negative crossover Normal scapular function observed. No painful arc and no drop arm sign. Negative apprehension Contralateral side minimal discomfort and minimal impingement noted  MSK US performed of: Right This study was ordered, performed, and interpreted by Charlann Boxer D.O.  Shoulder:   Supraspinatus:  Patient's previous tear is significantly smaller with 10% of the tendon on the articular side and only intersubstance noted. No retraction. Infraspinatus:  Appears normal on long and transverse views. Subscapularis:  Appears normal on long and transverse views. Teres Minor:  Appears normal on long and transverse views. AC joint: Mild arthritic changes Glenohumeral Joint:  Appears normal without effusion. Glenoid Labrum: Continued chronic degenerative changes noted Biceps Tendon:  Appears normal  on long and transverse views, no fraying of tendon, tendon located in intertubercular groove, no subluxation with shoulder internal or external rotation. No increased power doppler signal. Impression: Chronic degenerative  changes of the labrum and healing supraspinatus tear.       Impression and Recommendations:     This case required medical decision making of moderate complexity.

## 2015-07-23 NOTE — Assessment & Plan Note (Signed)
Patient shows significant healing at this time being 6 weeks out from the injection. We discussed the icing regimen. Patient was unable to tolerate the nitroglycerin and discontinued on his own. We discussed continuing the icing regimen. In 2 weeks patient given an exercise prescription to start increasing activity slowly. We discussed anti-inflammatories as needed. Follow-up with me again in 2-3 months if not completely resolved. Spent  25 minutes with patient face-to-face and had greater than 50% of counseling including as described above in assessment and plan.

## 2015-07-23 NOTE — Patient Instructions (Signed)
Good to see you  Tell your bodgard you are doing well Ice still if you need it 2 more weeks of taking it light and then start to increase the weight 10% a week  See me one more time in 2 months if not perfect!

## 2015-07-25 ENCOUNTER — Ambulatory Visit (INDEPENDENT_AMBULATORY_CARE_PROVIDER_SITE_OTHER): Payer: Managed Care, Other (non HMO) | Admitting: Internal Medicine

## 2015-07-25 ENCOUNTER — Encounter: Payer: Self-pay | Admitting: Internal Medicine

## 2015-07-25 VITALS — BP 114/70 | HR 70 | Temp 97.5°F | Resp 12 | Ht 74.0 in | Wt 200.5 lb

## 2015-07-25 DIAGNOSIS — Z113 Encounter for screening for infections with a predominantly sexual mode of transmission: Secondary | ICD-10-CM

## 2015-07-25 DIAGNOSIS — E785 Hyperlipidemia, unspecified: Secondary | ICD-10-CM | POA: Diagnosis not present

## 2015-07-25 DIAGNOSIS — Z0000 Encounter for general adult medical examination without abnormal findings: Secondary | ICD-10-CM

## 2015-07-25 DIAGNOSIS — R748 Abnormal levels of other serum enzymes: Secondary | ICD-10-CM

## 2015-07-25 DIAGNOSIS — R351 Nocturia: Secondary | ICD-10-CM

## 2015-07-25 DIAGNOSIS — R7989 Other specified abnormal findings of blood chemistry: Secondary | ICD-10-CM

## 2015-07-25 DIAGNOSIS — R5383 Other fatigue: Secondary | ICD-10-CM | POA: Diagnosis not present

## 2015-07-25 LAB — URINALYSIS, ROUTINE W REFLEX MICROSCOPIC
Bilirubin Urine: NEGATIVE
HGB URINE DIPSTICK: NEGATIVE
Ketones, ur: NEGATIVE
LEUKOCYTES UA: NEGATIVE
NITRITE: NEGATIVE
RBC / HPF: NONE SEEN (ref 0–?)
Specific Gravity, Urine: <=1.005 — AB (ref 1.000–1.030)
Total Protein, Urine: NEGATIVE
Urine Glucose: NEGATIVE
Urobilinogen, UA: 0.2 (ref 0.0–1.0)
WBC, UA: NONE SEEN (ref 0–?)
pH: 6.5 (ref 5.0–8.0)

## 2015-07-25 LAB — CBC WITH DIFFERENTIAL/PLATELET
BASOS PCT: 0.7 % (ref 0.0–3.0)
Basophils Absolute: 0 10*3/uL (ref 0.0–0.1)
EOS PCT: 2.7 % (ref 0.0–5.0)
Eosinophils Absolute: 0.2 10*3/uL (ref 0.0–0.7)
HCT: 49.4 % (ref 39.0–52.0)
Hemoglobin: 16.8 g/dL (ref 13.0–17.0)
LYMPHS ABS: 2 10*3/uL (ref 0.7–4.0)
Lymphocytes Relative: 32.3 % (ref 12.0–46.0)
MCHC: 34 g/dL (ref 30.0–36.0)
MCV: 89.4 fl (ref 78.0–100.0)
MONO ABS: 0.6 10*3/uL (ref 0.1–1.0)
Monocytes Relative: 10.6 % (ref 3.0–12.0)
NEUTROS ABS: 3.3 10*3/uL (ref 1.4–7.7)
NEUTROS PCT: 53.7 % (ref 43.0–77.0)
Platelets: 164 10*3/uL (ref 150.0–400.0)
RBC: 5.53 Mil/uL (ref 4.22–5.81)
RDW: 13 % (ref 11.5–15.5)
WBC: 6.1 10*3/uL (ref 4.0–10.5)

## 2015-07-25 LAB — LIPID PANEL
CHOLESTEROL: 170 mg/dL (ref 0–200)
HDL: 53.1 mg/dL (ref >39.00)
LDL Cholesterol: 103 mg/dL — ABNORMAL HIGH (ref 0–99)
NONHDL: 116.91
Total CHOL/HDL Ratio: 3
Triglycerides: 71 mg/dL (ref 0.0–149.0)
VLDL: 14.2 mg/dL (ref 0.0–40.0)

## 2015-07-25 LAB — COMPREHENSIVE METABOLIC PANEL
ALBUMIN: 4.6 g/dL (ref 3.5–5.2)
ALT: 41 U/L (ref 0–53)
AST: 22 U/L (ref 0–37)
Alkaline Phosphatase: 58 U/L (ref 39–117)
BUN: 31 mg/dL — AB (ref 6–23)
CALCIUM: 10 mg/dL (ref 8.4–10.5)
CHLORIDE: 104 meq/L (ref 96–112)
CO2: 34 meq/L — AB (ref 19–32)
CREATININE: 1.05 mg/dL (ref 0.40–1.50)
GFR: 84.53 mL/min (ref >60.00)
GLUCOSE: 86 mg/dL (ref 70–99)
POTASSIUM: 4.6 meq/L (ref 3.5–5.1)
SODIUM: 136 meq/L (ref 135–145)
Total Bilirubin: 2.1 mg/dL — ABNORMAL HIGH (ref 0.2–1.2)
Total Protein: 6.6 g/dL (ref 6.0–8.3)

## 2015-07-25 LAB — TSH: TSH: 1.42 u[IU]/mL (ref 0.35–4.50)

## 2015-07-25 LAB — MICROALBUMIN / CREATININE URINE RATIO
Creatinine,U: 25.3 mg/dL
Microalb Creat Ratio: 2.8 mg/g (ref 0.0–30.0)
Microalb, Ur: <0.7 mg/dL (ref 0.0–1.9)

## 2015-07-25 LAB — PSA: PSA: 0.56 ng/mL (ref 0.10–4.00)

## 2015-07-25 LAB — CK: CK TOTAL: 229 U/L (ref 7–232)

## 2015-07-25 MED ORDER — DOXYCYCLINE HYCLATE 100 MG PO CAPS: 100.0000 mg | ORAL_CAPSULE | Freq: Two times a day (BID) | ORAL | Status: DC

## 2015-07-25 NOTE — Progress Notes (Signed)
Patient ID: Anthony [name], male    DOB: 08-Jul-1980  Age: 35 y.o. MRN: [id number]  The patient is here for annual PHYSICAL examination and management of other chronic and acute problems.  The risk factors are reflected in the social history.  The roster of all physicians providing medical care to patient - is listed in the Snapshot section of the chart.  Home safety : The patient has smoke detectors in the home. They wear seatbelts.  There are secured firearms at home. There is no violence in the home. Patient is a Engineer, structural with the [employer] .   There is no risks for hepatitis, STDs or HIV. There is no   history of blood transfusion. They have no travel history to infectious disease endemic areas of the world.  The patient has seen their dentist in the last six month. They have seen their eye doctor in the last year. T  They do not  have excessive sun exposure. Discussed the need for sun protection: hats, long sleeves and use of sunscreen if there is significant sun exposure.   Diet: the importance of a healthy diet is discussed. They do have a healthy diet.  The benefits of regular aerobic exercise were discussed. She walks 4 times per week ,  20 minutes.   Depression screen: there are no signs or vegative symptoms of depression- irritability, change in appetite, anhedonia, sadness/tearfullness. The following portions of the patient's history were reviewed and updated as appropriate: allergies, current medications, past family history, past medical history,  past surgical history, past social history  and problem list.  Visual acuity was not assessed per patient preference since she has regular follow up with her ophthalmologist. Hearing and body mass index were assessed and reviewed.   During the course of the visit the patient was educated and counseled about appropriate screening and preventive services including : fall prevention , diabetes screening, nutrition counseling,  colorectal cancer screening, and recommended immunizations.    CC: The primary encounter diagnosis was Nocturia more than twice per night. Diagnoses of Elevated serum creatinine, Hyperlipidemia, Screening for STD (sexually transmitted disease), Other fatigue, and Encounter for preventive health examination were also pertinent to this visit.   Shoulder pain.  His  Supra spinatus tear and subscapularis tears diangosed by zach smith are healing  Recent episode of cellulitis of the knee   , treated to resolution with doxycycline .  Discussed potential modes of contact.   Nocturia:  Voiding 3-4 times per night.  No dysuria.     History Mister has no past medical history on file.   He has no past surgical history on file.   His family history includes Arthritis in his paternal grandfather; Cancer in his paternal grandfather; Cancer (age of onset: 57) in his paternal uncle; Hearing loss in his paternal uncle; Heart disease in his paternal uncle.He reports that he has never smoked. He has never used smokeless tobacco. He reports that he drinks about 6.0 oz of alcohol per week. He reports that he does not use illicit drugs.  Outpatient Prescriptions Prior to Visit  Medication Sig Dispense Refill  . pantoprazole (PROTONIX) 40 MG tablet Take 1 tablet (40 mg total) by mouth daily. (Patient not taking: Reported on 07/25/2015) 30 tablet 2  . ranitidine (ZANTAC) 75 MG tablet Take 75 mg by mouth 2 (two) times daily. Reported on 07/25/2015    . doxycycline (VIBRAMYCIN) 100 MG capsule Take 1 capsule (100 mg total) by mouth 2 (two)  times daily. 20 capsule 0   No facility-administered medications prior to visit.    Review of Systems   Patient denies headache, fevers, malaise, unintentional weight loss, skin rash, eye pain, sinus congestion and sinus pain, sore throat, dysphagia,  hemoptysis , cough, dyspnea, wheezing, chest pain, palpitations, orthopnea, edema, abdominal pain, nausea, melena, diarrhea,  constipation, flank pain, dysuria, hematuria, urinary  Frequency, nocturia, numbness, tingling, seizures,  Focal weakness, Loss of consciousness,  Tremor, insomnia, depression, anxiety, and suicidal ideation.     Objective:  BP 114/70 mmHg  Pulse 70  Temp(Src) 97.5 F (36.4 C) (Oral)  Resp 12  Ht 6\' 2"  (1.88 m)  Wt 200 lb 8 oz (90.946 kg)  BMI 25.73 kg/m2  SpO2 97%  Physical Exam   General appearance: alert, cooperative and appears stated age Ears: normal TM's and external ear canals both ears Throat: lips, mucosa, and tongue normal; teeth and gums normal Neck: no adenopathy, no carotid bruit, supple, symmetrical, trachea midline and thyroid not enlarged, symmetric, no tenderness/mass/nodules Back: symmetric, no curvature. ROM normal. No CVA tenderness. Lungs: clear to auscultation bilaterally Heart: regular rate and rhythm, S1, S2 normal, no murmur, click, rub or gallop Abdomen: soft, non-tender; bowel sounds normal; no masses,  no organomegaly Pulses: 2+ and symmetric Skin: Skin color, texture, turgor normal. No rashes or lesions Lymph nodes: Cervical, supraclavicular, and axillary nodes normal.   Assessment & Plan:   Problem List Items Addressed This Visit    Encounter for preventive health examination    Annual comprehensive preventive exam was done as well as an evaluation and management of acute and chronic conditions .  During the course of the visit the patient was educated and counseled about appropriate screening and preventive services including :  diabetes screening, lipid analysis with projected  10 year  risk for CAD , nutrition counseling, prostate and colorectal cancer screening, and recommended immunizations.  Printed recommendations for health maintenance screenings was given.   Lab Results  Component Value Date   CHOL 170 07/25/2015   HDL 53.10 07/25/2015   LDLCALC 103* 07/25/2015   TRIG 71.0 07/25/2015   CHOLHDL 3 07/25/2015   Lab Results  Component  Value Date   TSH 1.42 07/25/2015   .       Nocturia more than twice per night - Primary    PSA and urinalysis are both normal.  Suggested restricting caffeine to pre noon .  And liquids after 7 pm  Lab Results  Component Value Date   PSA 0.56 07/25/2015   .      Relevant Orders   POCT urinalysis dipstick   Urinalysis, Routine w reflex microscopic (not at [facility]) (Completed)   Urine culture (Completed)   PSA (Completed)    Other Visit Diagnoses    Elevated serum creatinine        Relevant Orders    CK (Completed)    Microalbumin / creatinine urine ratio (Completed)    Hyperlipidemia        Relevant Orders    Lipid panel (Completed)    Screening for STD (sexually transmitted disease)        Other fatigue        Relevant Orders    Comprehensive metabolic panel (Completed)    Hepatitis C antibody (Completed)    HIV antibody (Completed)    TSH (Completed)    CBC with Differential/Platelet (Completed)       I have discontinued Mr. Benally doxycycline. I am also having him start  on doxycycline. Additionally, I am having him maintain his ranitidine and pantoprazole.  Meds ordered this encounter  Medications  . doxycycline (VIBRAMYCIN) 100 MG capsule    Sig: Take 1 capsule (100 mg total) by mouth 2 (two) times daily.    Dispense:  14 capsule    Refill:  0    Medications Discontinued During This Encounter  Medication Reason  . doxycycline (VIBRAMYCIN) 100 MG capsule Completed Course    Follow-up: No Follow-up on file.   [name], MD

## 2015-07-25 NOTE — Progress Notes (Signed)
Pre-visit discussion using our clinic review tool. No additional management support is needed unless otherwise documented below in the visit note.  

## 2015-07-25 NOTE — Patient Instructions (Addendum)
You an use ibuprofen 800 mg every 8 hours ( max dose 2400 mg daily)  You can add tylenol up to 2000 mg daily for long term use (4000 mg if just using for 1-3 days)  Save the script for doxy for un explained fever , body aches and headache in summer , OR If you get a tick bite that starts  To get red and swollen  .Health Maintenance, Male A healthy lifestyle and preventative care can promote health and wellness.  Maintain regular health, dental, and eye exams.  Eat a healthy diet. Foods like vegetables, fruits, whole grains, low-fat dairy products, and lean protein foods contain the nutrients you need and are low in calories. Decrease your intake of foods high in solid fats, added sugars, and salt. Get information about a proper diet from your health care provider, if necessary.  Regular physical exercise is one of the most important things you can do for your health. Most adults should get at least 150 minutes of moderate-intensity exercise (any activity that increases your heart rate and causes you to sweat) each week. In addition, most adults need muscle-strengthening exercises on 2 or more days a week.   Maintain a healthy weight. The body mass index (BMI) is a screening tool to identify possible weight problems. It provides an estimate of body fat based on height and weight. Your health care provider can find your BMI and can help you achieve or maintain a healthy weight. For males 20 years and older:  A BMI below 18.5 is considered underweight.  A BMI of 18.5 to 24.9 is normal.  A BMI of 25 to 29.9 is considered overweight.  A BMI of 30 and above is considered obese.  Maintain normal blood lipids and cholesterol by exercising and minimizing your intake of saturated fat. Eat a balanced diet with plenty of fruits and vegetables. Blood tests for lipids and cholesterol should begin at age 32 and be repeated every 5 years. If your lipid or cholesterol levels are high, you are over age 33,  or you are at high risk for heart disease, you may need your cholesterol levels checked more frequently.Ongoing high lipid and cholesterol levels should be treated with medicines if diet and exercise are not working.  If you smoke, find out from your health care provider how to quit. If you do not use tobacco, do not start.  Lung cancer screening is recommended for adults aged 17-80 years who are at high risk for developing lung cancer because of a history of smoking. A yearly low-dose CT scan of the lungs is recommended for people who have at least a 30-pack-year history of smoking and are current smokers or have quit within the past 15 years. A pack year of smoking is smoking an average of 1 pack of cigarettes a day for 1 year (for example, a 30-pack-year history of smoking could mean smoking 1 pack a day for 30 years or 2 packs a day for 15 years). Yearly screening should continue until the smoker has stopped smoking for at least 15 years. Yearly screening should be stopped for people who develop a health problem that would prevent them from having lung cancer treatment.  If you choose to drink alcohol, do not have more than 2 drinks per day. One drink is considered to be 12 oz (360 mL) of beer, 5 oz (150 mL) of wine, or 1.5 oz (45 mL) of liquor.  Avoid the use of street drugs. Do not  share needles with anyone. Ask for help if you need support or instructions about stopping the use of drugs.  High blood pressure causes heart disease and increases the risk of stroke. High blood pressure is more likely to develop in:  People who have blood pressure in the end of the normal range (100-139/85-89 mm Hg).  People who are overweight or obese.  People who are African American.  If you are 106-21 years of age, have your blood pressure checked every 3-5 years. If you are 69 years of age or older, have your blood pressure checked every year. You should have your blood pressure measured twice--once when you  are at a hospital or clinic, and once when you are not at a hospital or clinic. Record the average of the two measurements. To check your blood pressure when you are not at a hospital or clinic, you can use:  An automated blood pressure machine at a pharmacy.  A home blood pressure monitor.  If you are 45-78 years old, ask your health care provider if you should take aspirin to prevent heart disease.  Diabetes screening involves taking a blood sample to check your fasting blood sugar level. This should be done once every 3 years after age 27 if you are at a normal weight and without risk factors for diabetes. Testing should be considered at a younger age or be carried out more frequently if you are overweight and have at least 1 risk factor for diabetes.  Colorectal cancer can be detected and often prevented. Most routine colorectal cancer screening begins at the age of 11 and continues through age 63. However, your health care provider may recommend screening at an earlier age if you have risk factors for colon cancer. On a yearly basis, your health care provider may provide home test kits to check for hidden blood in the stool. A small camera at the end of a tube may be used to directly examine the colon (sigmoidoscopy or colonoscopy) to detect the earliest forms of colorectal cancer. Talk to your health care provider about this at age 56 when routine screening begins. A direct exam of the colon should be repeated every 5-10 years through age 32, unless early forms of precancerous polyps or small growths are found.  People who are at an increased risk for hepatitis B should be screened for this virus. You are considered at high risk for hepatitis B if:  You were born in a country where hepatitis B occurs often. Talk with your health care provider about which countries are considered high risk.  Your parents were born in a high-risk country and you have not received a shot to protect against  hepatitis B (hepatitis B vaccine).  You have HIV or AIDS.  You use needles to inject street drugs.  You live with, or have sex with, someone who has hepatitis B.  You are a man who has sex with other men (MSM).  You get hemodialysis treatment.  You take certain medicines for conditions like cancer, organ transplantation, and autoimmune conditions.  Hepatitis C blood testing is recommended for all people born from [birth year] through 1967 and any individual with known risk factors for hepatitis C.  Healthy men should no longer receive prostate-specific antigen (PSA) blood tests as part of routine cancer screening. Talk to your health care provider about prostate cancer screening.  Testicular cancer screening is not recommended for adolescents or adult males who have no symptoms. Screening includes self-exam, a health  care provider exam, and other screening tests. Consult with your health care provider about any symptoms you have or any concerns you have about testicular cancer.  Practice safe sex. Use condoms and avoid high-risk sexual practices to reduce the spread of sexually transmitted infections (STIs).  You should be screened for STIs, including gonorrhea and chlamydia if:  You are sexually active and are younger than 24 years.  You are older than 24 years, and your health care provider tells you that you are at risk for this type of infection.  Your sexual activity has changed since you were last screened, and you are at an increased risk for chlamydia or gonorrhea. Ask your health care provider if you are at risk.  If you are at risk of being infected with HIV, it is recommended that you take a prescription medicine daily to prevent HIV infection. This is called pre-exposure prophylaxis (PrEP). You are considered at risk if:  You are a man who has sex with other men (MSM).  You are a heterosexual man who is sexually active with multiple partners.  You take drugs by  injection.  You are sexually active with a partner who has HIV.  Talk with your health care provider about whether you are at high risk of being infected with HIV. If you choose to begin PrEP, you should first be tested for HIV. You should then be tested every 3 months for as long as you are taking PrEP.  Use sunscreen. Apply sunscreen liberally and repeatedly throughout the day. You should seek shade when your shadow is shorter than you. Protect yourself by wearing long sleeves, pants, a wide-brimmed hat, and sunglasses year round whenever you are outdoors.  Tell your health care provider of new moles or changes in moles, especially if there is a change in shape or color. Also, tell your health care provider if a mole is larger than the size of a pencil eraser.  A one-time screening for abdominal aortic aneurysm (AAA) and surgical repair of large AAAs by ultrasound is recommended for men aged 35-75 years who are current or former smokers.  Stay current with your vaccines (immunizations).   This information is not intended to replace advice given to you by your health care provider. Make sure you discuss any questions you have with your health care provider.   Document Released: 07/18/2007 Document Revised: 02/09/2014 Document Reviewed: 06/16/2010 Elsevier Interactive Patient Education [employer].

## 2015-07-26 LAB — HIV ANTIBODY (ROUTINE TESTING W REFLEX): HIV 1&2 Ab, 4th Generation: NONREACTIVE

## 2015-07-26 LAB — HEPATITIS C ANTIBODY: HCV Ab: NEGATIVE

## 2015-07-27 DIAGNOSIS — R351 Nocturia: Secondary | ICD-10-CM | POA: Insufficient documentation

## 2015-07-27 LAB — URINE CULTURE
Colony Count: NO GROWTH
ORGANISM ID, BACTERIA: NO GROWTH

## 2015-07-27 NOTE — Assessment & Plan Note (Addendum)
PSA and urinalysis are both normal.  Suggested restricting caffeine to pre noon .  And liquids after 7 pm  Lab Results  Component Value Date   PSA 0.56 07/25/2015   .

## 2015-07-27 NOTE — Assessment & Plan Note (Addendum)
Annual comprehensive preventive exam was done as well as an evaluation and management of acute and chronic conditions .  During the course of the visit the patient was educated and counseled about appropriate screening and preventive services including :  diabetes screening, lipid analysis with projected  10 year  risk for CAD , nutrition counseling, prostate and colorectal cancer screening, and recommended immunizations.  Printed recommendations for health maintenance screenings was given.   Lab Results  Component Value Date   CHOL 170 07/25/2015   HDL 53.10 07/25/2015   LDLCALC 103* 07/25/2015   TRIG 71.0 07/25/2015   CHOLHDL 3 07/25/2015   Lab Results  Component Value Date   TSH 1.42 07/25/2015   .

## 2015-07-28 ENCOUNTER — Encounter: Payer: Self-pay | Admitting: Internal Medicine

## 2015-09-10 ENCOUNTER — Other Ambulatory Visit: Payer: Self-pay | Admitting: Internal Medicine

## 2015-09-13 ENCOUNTER — Other Ambulatory Visit: Payer: Self-pay | Admitting: Emergency Medicine

## 2015-09-13 MED ORDER — DOXYCYCLINE HYCLATE 100 MG PO CAPS: 100.0000 mg | ORAL_CAPSULE | Freq: Two times a day (BID) | ORAL | 0 refills | Status: DC

## 2015-09-13 NOTE — Telephone Encounter (Signed)
MED REFILLED  

## 2015-09-21 NOTE — Progress Notes (Deleted)
[name] [address] [place], [place] [address] Phone: [phone] Subjective:    CC: Right shoulder pain f/u  [id number]  Jakarri Holz is a 35 y.o. male coming in with complaint of right shoulder pain. Found to have rotator cuff tear and likely labral tear.  Had injections, doing better about 60% Seems to be increasing strength. Happy with the results of far. No nighttime problems. Patient stopped the nitroglycerin. Feels like he did not need it anymore and was causing headaches. Denies any radiation down the arm. Overall feeling much better..    No past medical history on file. No past surgical history on file. Social History  Substance Use Topics  . Smoking status: Never Smoker  . Smokeless tobacco: Never Used  . Alcohol use 6.0 oz/week    10 Cans of beer per week   Allergies  Allergen Reactions  . Erythromycin    Family History  Problem Relation Age of Onset  . Cancer Paternal Uncle 7    prostate ca  . Heart disease Paternal Uncle     died of MI  . Hearing loss Paternal Uncle   . Cancer Paternal Grandfather     aplastic anemia?   Marland Kitchen Arthritis Paternal Grandfather     temporal arteritis     Past medical history, social, surgical and family history all reviewed in electronic medical record.   Review of Systems: No headache, visual changes, nausea, vomiting, diarrhea, constipation, dizziness, abdominal pain, skin rash, fevers, chills, night sweats, weight loss, swollen lymph nodes, body aches, joint swelling, muscle aches, chest pain, shortness of breath, mood changes.   Objective  There were no vitals taken for this visit.  General: No apparent distress alert and oriented x3 mood and affect normal, dressed appropriately.  HEENT: Pupils equal, extraocular movements intact  Respiratory: Patient's speak in full sentences and does not appear short of breath  Cardiovascular: No lower extremity edema, non tender, no erythema  Skin:  Warm dry intact with no signs of infection or rash on extremities or on axial skeleton.  Abdomen: Soft nontender  Neuro: Cranial nerves II through XII are intact, neurovascularly intact in all extremities with 2+ DTRs and 2+ pulses.  Lymph: No lymphadenopathy of posterior or anterior cervical chain or axillae bilaterally.  Gait normal with good balance and coordination.  MSK:  Non tender with full range of motion and good stability and symmetric strength and tone of  elbows, wrist, hip, knee and ankles bilaterally.  Shoulder: right  Inspection reveals no abnormalities, atrophy or asymmetry. TTP over the posterior shoulder  ROM is full in all planes passively. Increase internal Range of motion compared ton contralaterlal side.  Talar cuff strength full Speeds and Yergason's tests normal.  labral pathology noted with positive Obrien's, negative clunk and good stability.  Negative crossover Normal scapular function observed. No painful arc and no drop arm sign. Negative apprehension Contralateral side minimal discomfort and minimal impingement noted  MSK US performed of: Right This study was ordered, performed, and interpreted by Charlann Boxer D.O.  Shoulder:   Supraspinatus:  Patient's previous tear is significantly smaller with 10% of the tendon on the articular side and only intersubstance noted. No retraction. Infraspinatus:  Appears normal on long and transverse views. Subscapularis:  Appears normal on long and transverse views. Teres Minor:  Appears normal on long and transverse views. AC joint: Mild arthritic changes Glenohumeral Joint:  Appears normal without effusion. Glenoid Labrum: Continued chronic degenerative changes noted  Biceps Tendon:  Appears normal on long and transverse views, no fraying of tendon, tendon located in intertubercular groove, no subluxation with shoulder internal or external rotation. Impression: Chronic degenerative changes of the labrum and healing  supraspinatus tear.       Impression and Recommendations:     This case required medical decision making of moderate complexity.

## 2015-09-23 ENCOUNTER — Ambulatory Visit: Payer: Managed Care, Other (non HMO) | Admitting: Family Medicine

## 2015-12-17 ENCOUNTER — Ambulatory Visit: Payer: Self-pay | Admitting: Physician Assistant

## 2015-12-19 ENCOUNTER — Ambulatory Visit: Payer: Self-pay | Admitting: Physician Assistant

## 2015-12-19 ENCOUNTER — Encounter: Payer: Self-pay | Admitting: Physician Assistant

## 2015-12-19 VITALS — BP 110/60 | HR 80 | Temp 98.1°F

## 2015-12-19 DIAGNOSIS — R5383 Other fatigue: Secondary | ICD-10-CM

## 2015-12-19 DIAGNOSIS — R6882 Decreased libido: Secondary | ICD-10-CM

## 2015-12-19 NOTE — Progress Notes (Addendum)
S: states is really tired and has no sex drive, hx of low T but has never been treated, feels that he is getting worse, had physical done in June but not sure if she checked his T level, no cp/sob, no v/d  O: vitals wnl, nad, skin intact, lungs c t a, cv rrr  A: fatigue, low libido, ?low T  P: labs drawn today, if T level is low will send in rx for clomid to [facility] [address]  Free testosterone was low at 6.9, sent in rx for clomid 50mg  every other day OR 25mg  qd

## 2015-12-20 LAB — SPECIMEN STATUS

## 2015-12-21 LAB — B12 AND FOLATE PANEL
Folate: 15.6 ng/mL (ref >3.0)
Vitamin B-12: >2000 pg/mL — ABNORMAL HIGH (ref 211–946)

## 2015-12-21 LAB — VITAMIN D 25 HYDROXY (VIT D DEFICIENCY, FRACTURES): Vit D, 25-Hydroxy: 59.1 ng/mL (ref 30.0–100.0)

## 2015-12-21 LAB — PROLACTIN: Prolactin: 12.4 ng/mL (ref 4.0–15.2)

## 2015-12-21 LAB — TESTOSTERONE,FREE AND TOTAL
TESTOSTERONE: 411 ng/dL (ref 264–916)
Testosterone, Free: 6.9 pg/mL — ABNORMAL LOW (ref 8.7–25.1)

## 2015-12-23 MED ORDER — CLOMIPHENE CITRATE 50 MG PO TABS: ORAL_TABLET | ORAL | 2 refills | Status: DC

## 2015-12-23 NOTE — Addendum Note (Signed)
Addended by: Versie Starks on: 12/23/2015 08:41 AM   Modules accepted: Orders

## 2016-03-02 ENCOUNTER — Encounter: Payer: Self-pay | Admitting: Physician Assistant

## 2016-03-02 ENCOUNTER — Ambulatory Visit: Payer: Self-pay | Admitting: Physician Assistant

## 2016-03-02 VITALS — BP 138/80 | HR 84 | Temp 98.0°F

## 2016-03-02 DIAGNOSIS — R6882 Decreased libido: Secondary | ICD-10-CM

## 2016-03-02 DIAGNOSIS — T148XXA Other injury of unspecified body region, initial encounter: Secondary | ICD-10-CM

## 2016-03-02 MED ORDER — CLOMIPHENE CITRATE 50 MG PO TABS: ORAL_TABLET | ORAL | 2 refills | Status: DC

## 2016-03-02 MED ORDER — SULFAMETHOXAZOLE-TRIMETHOPRIM 800-160 MG PO TABS: 1.0000 | ORAL_TABLET | Freq: Two times a day (BID) | ORAL | 0 refills | Status: DC

## 2016-03-02 MED ORDER — TETANUS-DIPHTH-ACELL PERTUSSIS 5-2.5-18.5 LF-MCG/0.5 IM SUSP
0.5000 mL | Freq: Once | INTRAMUSCULAR | Status: AC
  Administered 2016-03-04: 0.5 mL via INTRAMUSCULAR

## 2016-03-02 NOTE — Progress Notes (Signed)
S: c/o splinter in finger, finally got it out on Sunday but now his finger is red/swollen/and sore, no fever/chills, also states he is feeling much better since taking the clomid, states his energy level and libido seem to have gotten better, ?when last tdap was  O: vitals wnl, nad, skin on r hand , index finger, red swollen, tender, full rom, n/v intact  A: wound infection secondary to splinter  P: tdap, refill on clomid, rx septra ds 1 po bid x7d for hand infection, pt to return at later date for physical and biometric forms

## 2016-03-03 ENCOUNTER — Telehealth: Payer: Managed Care, Other (non HMO) | Admitting: Family

## 2016-03-03 DIAGNOSIS — R6889 Other general symptoms and signs: Secondary | ICD-10-CM

## 2016-03-03 LAB — TESTOSTERONE,FREE AND TOTAL
TESTOSTERONE FREE: 13.4 pg/mL (ref 8.7–25.1)
Testosterone: 751 ng/dL (ref 264–916)

## 2016-03-03 LAB — CBC WITH DIFFERENTIAL/PLATELET
Basophils Absolute: 0 10*3/uL (ref 0.0–0.2)
Basos: 0 %
EOS (ABSOLUTE): 0.2 10*3/uL (ref 0.0–0.4)
Eos: 1 %
Hematocrit: 48.6 % (ref 37.5–51.0)
Hemoglobin: 16.9 g/dL (ref 13.0–17.7)
Immature Grans (Abs): 0 10*3/uL (ref 0.0–0.1)
Immature Granulocytes: 0 %
Lymphocytes Absolute: 2.1 10*3/uL (ref 0.7–3.1)
Lymphs: 16 %
MCH: 31.1 pg (ref 26.6–33.0)
MCHC: 34.8 g/dL (ref 31.5–35.7)
MCV: 89 fL (ref 79–97)
Monocytes Absolute: 1.1 10*3/uL — ABNORMAL HIGH (ref 0.1–0.9)
Monocytes: 9 %
Neutrophils Absolute: 9.5 10*3/uL — ABNORMAL HIGH (ref 1.4–7.0)
Neutrophils: 74 %
Platelets: 152 10*3/uL (ref 150–379)
RBC: 5.44 x10E6/uL (ref 4.14–5.80)
RDW: 13.7 % (ref 12.3–15.4)
WBC: 12.9 10*3/uL — ABNORMAL HIGH (ref 3.4–10.8)

## 2016-03-03 LAB — PSA: PROSTATE SPECIFIC AG, SERUM: 0.9 ng/mL (ref 0.0–4.0)

## 2016-03-03 MED ORDER — OSELTAMIVIR PHOSPHATE 75 MG PO CAPS: 75.0000 mg | ORAL_CAPSULE | Freq: Two times a day (BID) | ORAL | 0 refills | Status: DC

## 2016-03-03 NOTE — Progress Notes (Signed)
E visit for Flu like symptoms   We are sorry that you are not feeling well.  Here is how we plan to help! Based on what you have shared with me it looks like you may have flu-like symptoms that should be watched but do not seem to indicate anti-viral treatment.  Influenza or "the flu" is   an infection caused by a respiratory virus. The flu virus is highly contagious and persons who did not receive their yearly flu vaccination may "catch" the flu from close contact.  We have anti-viral medications to treat the viruses that cause this infection. They are not a "cure" and only shorten the course of the infection. These prescriptions are most effective when they are given within the first 2 days of "flu" symptoms. Antiviral medication are indicated if you have a high risk of complications from the flu. You should  also consider an antiviral medication if you are in close contact with someone who is at risk. These medications can help patients avoid complications from the flu  but have side effects that you should know. Possible side effects from Tamiflu or oseltamivir include nausea, vomiting, diarrhea, dizziness, headaches, eye redness, sleep problems or other respiratory symptoms. You should not take Tamiflu if you have an allergy to oseltamivir or any to the ingredients in Tamiflu.  Based upon your symptoms and potential risk factors I have prescribed Oseltamivir (Tamiflu).  It has been sent to your designated pharmacy.  You will take one 75 mg capsule orally twice a day for the next 5 days.  ANYONE WHO HAS FLU SYMPTOMS SHOULD: . Stay home. The flu is highly contagious and going out or to work exposes others! . Be sure to drink plenty of fluids. Water is fine as well as fruit juices, sodas and electrolyte beverages. You may want to stay away from caffeine or alcohol. If you are nauseated, try taking small sips of liquids. How do you know if you are getting enough fluid? Your urine should be a pale  yellow or almost colorless. . Get rest. . Taking a steamy shower or using a humidifier may help nasal congestion and ease sore throat pain. Using a saline nasal spray works much the same way. . Cough drops, hard candies and sore throat lozenges may ease your cough. . Line up a caregiver. Have someone check on you regularly.   GET HELP RIGHT AWAY IF: . You cannot keep down liquids or your medications. . You become short of breath . Your fell like you are going to pass out or loose consciousness. . Your symptoms persist after you have completed your treatment plan MAKE SURE YOU   Understand these instructions.  Will watch your condition.  Will get help right away if you are not doing well or get worse.  Your e-visit answers were reviewed by a board certified advanced clinical practitioner to complete your personal care plan.  Depending on the condition, your plan could have included both over the counter or prescription medications.  If there is a problem please reply  once you have received a response from your provider.  Your safety is important to [place].  If you have drug allergies check your prescription carefully.    You can use MyChart to ask questions about today's visit, request a non-urgent call back, or ask for a work or school excuse for 24 hours related to this e-Visit. If it has been greater than 24 hours you will need to follow up with  your provider, or enter a new e-Visit to address those concerns.  You will get an e-mail in the next two days asking about your experience.  I hope that your e-visit has been valuable and will speed your recovery. Thank you for using e-visits.

## 2016-05-27 ENCOUNTER — Encounter: Payer: Self-pay | Admitting: Internal Medicine

## 2016-06-05 ENCOUNTER — Ambulatory Visit (INDEPENDENT_AMBULATORY_CARE_PROVIDER_SITE_OTHER): Payer: Managed Care, Other (non HMO) | Admitting: Internal Medicine

## 2016-06-05 ENCOUNTER — Encounter: Payer: Self-pay | Admitting: Internal Medicine

## 2016-06-05 VITALS — BP 126/78 | HR 75 | Temp 97.7°F | Resp 16 | Ht 74.0 in | Wt 222.2 lb

## 2016-06-05 DIAGNOSIS — L309 Dermatitis, unspecified: Secondary | ICD-10-CM | POA: Diagnosis not present

## 2016-06-05 DIAGNOSIS — Z113 Encounter for screening for infections with a predominantly sexual mode of transmission: Secondary | ICD-10-CM

## 2016-06-05 DIAGNOSIS — Z0000 Encounter for general adult medical examination without abnormal findings: Secondary | ICD-10-CM | POA: Diagnosis not present

## 2016-06-05 DIAGNOSIS — Z9189 Other specified personal risk factors, not elsewhere classified: Secondary | ICD-10-CM

## 2016-06-05 DIAGNOSIS — R7989 Other specified abnormal findings of blood chemistry: Secondary | ICD-10-CM | POA: Diagnosis not present

## 2016-06-05 MED ORDER — HALCINONIDE 0.1 % EX OINT: TOPICAL_OINTMENT | CUTANEOUS | 0 refills | Status: DC

## 2016-06-05 MED ORDER — MOMETASONE FUROATE 0.1 % EX OINT: TOPICAL_OINTMENT | Freq: Every day | CUTANEOUS | 3 refills | Status: DC

## 2016-06-05 NOTE — Patient Instructions (Signed)
If the Clomid is still too $$$$,  We can change your supplementation to injectible testosterone that Clarissa can administer twice weekly .    Consider having a PPD placed on a Monday  Tuesday or Wednesday to screen annually for TB    Health Maintenance, Male A healthy lifestyle and preventive care is important for your health and wellness. Ask your health care provider about what schedule of regular examinations is right for you. What should I know about weight and diet?  Eat a Healthy Diet  Eat plenty of vegetables, fruits, whole grains, low-fat dairy products, and lean protein.  Do not eat a lot of foods high in solid fats, added sugars, or salt. Maintain a Healthy Weight  Regular exercise can help you achieve or maintain a healthy weight. You should:  Do at least 150 minutes of exercise each week. The exercise should increase your heart rate and make you sweat (moderate-intensity exercise).  Do strength-training exercises at least twice a week. Watch Your Levels of Cholesterol and Blood Lipids  Have your blood tested for lipids and cholesterol every 5 years starting at 37 years of age. If you are at high risk for heart disease, you should start having your blood tested when you are 38 years old. You may need to have your cholesterol levels checked more often if:  Your lipid or cholesterol levels are high.  You are older than 38 years of age.  You are at high risk for heart disease. What should I know about cancer screening? Many types of cancers can be detected early and may often be prevented. Lung Cancer  You should be screened every year for lung cancer if:  You are a current smoker who has smoked for at least 30 years.  You are a former smoker who has quit within the past 15 years.  Talk to your health care provider about your screening options, when you should start screening, and how often you should be screened. Colorectal Cancer  Routine colorectal cancer  screening usually begins at 38 years of age and should be repeated every 5-10 years until you are 38 years old. You may need to be screened more often if early forms of precancerous polyps or small growths are found. Your health care provider may recommend screening at an earlier age if you have risk factors for colon cancer.  Your health care provider may recommend using home test kits to check for hidden blood in the stool.  A small camera at the end of a tube can be used to examine your colon (sigmoidoscopy or colonoscopy). This checks for the earliest forms of colorectal cancer. Prostate and Testicular Cancer  Depending on your age and overall health, your health care provider may do certain tests to screen for prostate and testicular cancer.  Talk to your health care provider about any symptoms or concerns you have about testicular or prostate cancer. Skin Cancer  Check your skin from head to toe regularly.  Tell your health care provider about any new moles or changes in moles, especially if:  There is a change in a mole's size, shape, or color.  You have a mole that is larger than a pencil eraser.  Always use sunscreen. Apply sunscreen liberally and repeat throughout the day.  Protect yourself by wearing long sleeves, pants, a wide-brimmed hat, and sunglasses when outside. What should I know about heart disease, diabetes, and high blood pressure?  If you are 37-20 years of age, have your  blood pressure checked every 3-5 years. If you are 99 years of age or older, have your blood pressure checked every year. You should have your blood pressure measured twice-once when you are at a hospital or clinic, and once when you are not at a hospital or clinic. Record the average of the two measurements. To check your blood pressure when you are not at a hospital or clinic, you can use:  An automated blood pressure machine at a pharmacy.  A home blood pressure monitor.  Talk to your health  care provider about your target blood pressure.  If you are between 39-23 years old, ask your health care provider if you should take aspirin to prevent heart disease.  Have regular diabetes screenings by checking your fasting blood sugar level.  If you are at a normal weight and have a low risk for diabetes, have this test once every three years after the age of 63.  If you are overweight and have a high risk for diabetes, consider being tested at a younger age or more often.  A one-time screening for abdominal aortic aneurysm (AAA) by ultrasound is recommended for men aged 34-75 years who are current or former smokers. What should I know about preventing infection? Hepatitis B  If you have a higher risk for hepatitis B, you should be screened for this virus. Talk with your health care provider to find out if you are at risk for hepatitis B infection. Hepatitis C  Blood testing is recommended for:  Everyone born from [birth year] through 1967.  Anyone with known risk factors for hepatitis C. Sexually Transmitted Diseases (STDs)  You should be screened each year for STDs including gonorrhea and chlamydia if:  You are sexually active and are younger than 38 years of age.  You are older than 38 years of age and your health care provider tells you that you are at risk for this type of infection.  Your sexual activity has changed since you were last screened and you are at an increased risk for chlamydia or gonorrhea. Ask your health care provider if you are at risk.  Talk with your health care provider about whether you are at high risk of being infected with HIV. Your health care provider may recommend a prescription medicine to help prevent HIV infection. What else can I do?  Schedule regular health, dental, and eye exams.  Stay current with your vaccines (immunizations).  Do not use any tobacco products, such as cigarettes, chewing tobacco, and e-cigarettes. If you need help quitting,  ask your health care provider.  Limit alcohol intake to no more than 2 drinks per day. One drink equals 12 ounces of beer, 5 ounces of wine, or 1 ounces of hard liquor.  Do not use street drugs.  Do not share needles.  Ask your health care provider for help if you need support or information about quitting drugs.  Tell your health care provider if you often feel depressed.  Tell your health care provider if you have ever been abused or do not feel safe at home. This information is not intended to replace advice given to you by your health care provider. Make sure you discuss any questions you have with your health care provider. Document Released: 07/18/2007 Document Revised: 09/18/2015 Document Reviewed: 10/23/2014 Elsevier Interactive Patient Education  2017 [employer].

## 2016-06-05 NOTE — Progress Notes (Signed)
Pre visit review using our clinic review tool, if applicable. No additional management support is needed unless otherwise documented below in the visit note. 

## 2016-06-05 NOTE — Progress Notes (Signed)
Patient ID: Anthony [name], male    DOB: 11-24-1980  Age: 36 y.o. MRN: [id number]  The patient is here for annual PREVENTIVE examination and management of other chronic and acute problems.   The risk factors are reflected in the social history.  The roster of all physicians providing medical care to patient - is listed in the Snapshot section of the chart.  Home safety : The patient has smoke detectors in the home. They wear seatbelts.  There are secured firearms at home. There is no violence in the home.   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  He has a recent history of exposure to blood during a combative arrest .  He has eczema and is concerned that the cracked skin on his palms provided an inadequate barrier ; this occurred over a month ago.   he is requesting testing for hep C and HIV.   There is no   history of blood transfusion. They have no travel history to infectious disease endemic areas of the world.  The patient has seen their dentist in the last six month. They have seen their eye doctor in the last year. They have had a biometric screening at work recently which included blood tests for undetermined labs .    They do not  have excessive sun exposure. Discussed the need for sun protection: hats, long sleeves and use of sunscreen if there is significant sun exposure.   Diet: the importance of a healthy diet is discussed. They do have a healthy diet.  The benefits of regular aerobic exercise were discussed. He exercises 6 days per week using weight lifting and cardioaerobics.   Depression screen: there are no signs or vegative symptoms of depression- irritability, change in appetite, anhedonia, sadness/tearfullness.  The following portions of the patient's history were reviewed and updated as appropriate: allergies, current medications, past family history, past medical history,  past surgical history, past social history  and problem  list.  Visual acuity was not assessed per patient preference since she has regular follow up with her ophthalmologist. Hearing and body mass index were assessed and reviewed.   During the course of the visit the patient was educated and counseled about appropriate screening and preventive services including : fall prevention , diabetes screening, nutrition counseling, colorectal cancer screening, and recommended immunizations.    CC: The primary encounter diagnosis was Low testosterone. Diagnoses of Screening for STD (sexually transmitted disease), Encounter for preventive health examination, Eczema of hand, At risk for HIV due to occupational risk factor, and Low testosterone in male were also pertinent to this visit.  History Anthony [name] has no past medical history on file.   He has no past surgical history on file.   His family history includes Arthritis in his paternal grandfather; Cancer in his paternal grandfather; Cancer (age of onset: 30) in his paternal uncle; Hearing loss in his paternal uncle; Heart disease in his paternal uncle.He reports that he has never smoked. He has never used smokeless tobacco. He reports that he drinks about 6.0 oz of alcohol per week . He reports that he does not use drugs.  Outpatient Medications Prior to Visit  Medication Sig Dispense Refill  . clomiPHENE (CLOMID) 50 MG tablet Take 1 pill every other day, or cut it in 1/2 and take 25mg  qd; 15 tablet 2  . oseltamivir (TAMIFLU) 75 MG capsule Take 1 capsule (75 mg total) by mouth  2 (two) times daily. 10 capsule 0  . doxycycline (VIBRAMYCIN) 100 MG capsule Take 1 capsule (100 mg total) by mouth 2 (two) times daily. (Patient not taking: Reported on 12/19/2015) 14 capsule 0  . pantoprazole (PROTONIX) 40 MG tablet Take 1 tablet (40 mg total) by mouth daily. (Patient not taking: Reported on 12/19/2015) 30 tablet 2  . ranitidine (ZANTAC) 75 MG tablet Take 75 mg by mouth 2 (two) times daily. Reported on 07/25/2015    .  sulfamethoxazole-trimethoprim (BACTRIM DS,SEPTRA DS) 800-160 MG tablet Take 1 tablet by mouth 2 (two) times daily. (Patient not taking: Reported on 06/05/2016) 14 tablet 0   No facility-administered medications prior to visit.     Review of Systems   Patient denies headache, fevers, malaise, unintentional weight loss, skin rash, eye pain, sinus congestion and sinus pain, sore throat, dysphagia,  hemoptysis , cough, dyspnea, wheezing, chest pain, palpitations, orthopnea, edema, abdominal pain, nausea, melena, diarrhea, constipation, flank pain, dysuria, hematuria, urinary  Frequency, nocturia, numbness, tingling, seizures,  Focal weakness, Loss of consciousness,  Tremor, insomnia, depression, anxiety, and suicidal ideation.      Objective:  BP 126/78 (BP Location: Left Arm, Patient Position: Sitting, Cuff Size: Large)   Pulse 75   Temp 97.7 F (36.5 C) (Oral)   Resp 16   Ht 6\' 2"  (1.88 m)   Wt 222 lb 3.2 oz (100.8 kg)   SpO2 98%   BMI 28.53 kg/m   Physical Exam   General appearance: alert, cooperative and appears stated age Head: Normocephalic, without obvious abnormality, atraumatic Eyes: conjunctivae/corneas clear. PERRL, EOM's intact. Fundi benign. Ears: normal TM's and external ear canals both ears Nose: Nares normal. Septum midline. Mucosa normal. No drainage or sinus tenderness. Throat: lips, mucosa, and tongue normal; teeth and gums normal Neck: no adenopathy, no carotid bruit, no JVD, supple, symmetrical, trachea midline and thyroid not enlarged, symmetric, no tenderness/mass/nodules Lungs: clear to auscultation bilaterally Breasts: normal appearance, no masses or tenderness Heart: regular rate and rhythm, S1, S2 normal, no murmur, click, rub or gallop Abdomen: soft, non-tender; bowel sounds normal; no masses,  no organomegaly Extremities: extremities normal, atraumatic, no cyanosis or edema Pulses: 2+ and symmetric Skin: Skin color, texture, turgor normal. No rashes or  lesions Neurologic: Alert and oriented X 3, normal strength and tone. Normal symmetric reflexes. Normal coordination and gait.      Assessment & Plan:   Problem List Items Addressed This Visit    At risk for HIV due to occupational risk factor    screening for HIV and Hep C due to recent contact with blood during a combative arrest      Eczema of hand    Mometasone ointment refilled per patient request       Encounter for preventive health examination    Annual comprehensive preventive exam was done as well as an evaluation and management of acute and chronic conditions .  During the course of the visit the patient was educated and counseled about appropriate screening and preventive services including :  diabetes screening, lipid analysis with projected  10 year  risk for CAD , nutrition counseling, prostate and colorectal cancer screening, and recommended immunizations.  Printed recommendations for health maintenance screenings was given.       Low testosterone in male    Diagnosed and treated with clomiphene.  Testosterone level is due       Other Visit Diagnoses    Low testosterone    -  Primary  Relevant Orders   Testos,Total,Free and SHBG (Male)   Screening for STD (sexually transmitted disease)       Relevant Orders   HIV antibody   Hepatitis C antibody      I have discontinued Mr. Pickering ranitidine, pantoprazole, doxycycline, sulfamethoxazole-trimethoprim, and Halcinonide. I am also having him start on mometasone. Additionally, I am having him maintain his clomiPHENE and oseltamivir.  Meds ordered this encounter  Medications  . DISCONTD: Halcinonide 0.1 % OINT    Sig: Apply twice daily to affected skin    Dispense:  30 g    Refill:  0  . mometasone (ELOCON) 0.1 % ointment    Sig: Apply topically daily.    Dispense:  45 g    Refill:  3    Medications Discontinued During This Encounter  Medication Reason  . doxycycline (VIBRAMYCIN) 100 MG capsule  Therapy completed  . pantoprazole (PROTONIX) 40 MG tablet Patient has not taken in last 30 days  . ranitidine (ZANTAC) 75 MG tablet Patient has not taken in last 30 days  . sulfamethoxazole-trimethoprim (BACTRIM DS,SEPTRA DS) 800-160 MG tablet Patient has not taken in last 30 days  . Halcinonide 0.1 % OINT     Follow-up: No Follow-up on file.   [name], MD

## 2016-06-06 LAB — HEPATITIS C ANTIBODY: HCV AB: NEGATIVE

## 2016-06-06 LAB — HIV ANTIBODY (ROUTINE TESTING W REFLEX): HIV 1&2 Ab, 4th Generation: NONREACTIVE

## 2016-06-07 DIAGNOSIS — R7989 Other specified abnormal findings of blood chemistry: Secondary | ICD-10-CM | POA: Insufficient documentation

## 2016-06-07 DIAGNOSIS — Z9189 Other specified personal risk factors, not elsewhere classified: Secondary | ICD-10-CM | POA: Insufficient documentation

## 2016-06-07 DIAGNOSIS — L309 Dermatitis, unspecified: Secondary | ICD-10-CM | POA: Insufficient documentation

## 2016-06-07 NOTE — Assessment & Plan Note (Signed)

## 2016-06-07 NOTE — Assessment & Plan Note (Addendum)
screening for HIV and Hep C due to recent contact with blood during a combative arrest

## 2016-06-07 NOTE — Assessment & Plan Note (Signed)
Diagnosed and treated with clomiphene.  Testosterone level is due

## 2016-06-07 NOTE — Assessment & Plan Note (Signed)
Mometasone ointment refilled per patient request

## 2016-06-10 ENCOUNTER — Encounter: Payer: Self-pay | Admitting: Internal Medicine

## 2016-06-10 LAB — TESTOS,TOTAL,FREE AND SHBG (FEMALE)
SEX HORMONE BINDING GLOB.: 56 nmol/L — AB (ref 10–50)
Testosterone, Free: 74.4 pg/mL (ref 35.0–155.0)
Testosterone,Total,LC/MS/MS: 641 ng/dL (ref 250–1100)

## 2016-06-15 ENCOUNTER — Other Ambulatory Visit: Payer: Self-pay | Admitting: Physician Assistant

## 2016-06-15 ENCOUNTER — Encounter: Payer: Self-pay | Admitting: Physician Assistant

## 2016-06-15 DIAGNOSIS — R6882 Decreased libido: Secondary | ICD-10-CM

## 2016-06-15 NOTE — Telephone Encounter (Signed)
Med refill for clomid approved

## 2016-07-27 ENCOUNTER — Encounter: Payer: Self-pay | Admitting: Internal Medicine

## 2016-09-10 ENCOUNTER — Encounter: Payer: Self-pay | Admitting: Physician Assistant

## 2016-09-21 ENCOUNTER — Encounter: Payer: Self-pay | Admitting: Internal Medicine

## 2016-09-21 DIAGNOSIS — R6882 Decreased libido: Secondary | ICD-10-CM

## 2016-09-22 MED ORDER — CLOMIPHENE CITRATE 50 MG PO TABS: ORAL_TABLET | ORAL | 2 refills | Status: DC

## 2016-09-22 NOTE — Telephone Encounter (Signed)
Last appt was 06/05/16, last refill was 06/15/16, Please advise.

## 2016-10-18 ENCOUNTER — Other Ambulatory Visit: Payer: Self-pay | Admitting: Internal Medicine

## 2016-12-20 ENCOUNTER — Other Ambulatory Visit: Payer: Self-pay | Admitting: Internal Medicine

## 2016-12-20 DIAGNOSIS — R6882 Decreased libido: Secondary | ICD-10-CM

## 2017-03-19 ENCOUNTER — Other Ambulatory Visit: Payer: Self-pay | Admitting: Internal Medicine

## 2017-03-19 DIAGNOSIS — R6882 Decreased libido: Secondary | ICD-10-CM

## 2017-03-19 NOTE — Telephone Encounter (Signed)
Refilled: 12/21/2016 Last OV: 06/05/2016 Next OV: not scheduled

## 2017-04-18 ENCOUNTER — Other Ambulatory Visit: Payer: Self-pay | Admitting: Internal Medicine

## 2017-04-19 NOTE — Telephone Encounter (Signed)
Refilled: 10/19/2016 Last OV: 06/05/2016 Next OV: 06/14/2017

## 2017-06-14 ENCOUNTER — Encounter: Payer: Self-pay | Admitting: Internal Medicine

## 2017-06-14 ENCOUNTER — Ambulatory Visit (INDEPENDENT_AMBULATORY_CARE_PROVIDER_SITE_OTHER): Payer: Managed Care, Other (non HMO) | Admitting: Internal Medicine

## 2017-06-14 VITALS — BP 130/70 | HR 82 | Temp 98.4°F | Resp 15 | Ht 74.0 in | Wt 224.2 lb

## 2017-06-14 DIAGNOSIS — R7989 Other specified abnormal findings of blood chemistry: Secondary | ICD-10-CM | POA: Diagnosis not present

## 2017-06-14 DIAGNOSIS — Z0000 Encounter for general adult medical examination without abnormal findings: Secondary | ICD-10-CM

## 2017-06-14 DIAGNOSIS — R6882 Decreased libido: Secondary | ICD-10-CM

## 2017-06-14 DIAGNOSIS — Z9189 Other specified personal risk factors, not elsewhere classified: Secondary | ICD-10-CM

## 2017-06-14 DIAGNOSIS — Z79899 Other long term (current) drug therapy: Secondary | ICD-10-CM

## 2017-06-14 MED ORDER — FLUTICASONE PROPIONATE 50 MCG/ACT NA SUSP: 2.0000 | Freq: Every day | NASAL | 11 refills | Status: DC

## 2017-06-14 MED ORDER — CLOMIPHENE CITRATE 50 MG PO TABS: ORAL_TABLET | ORAL | 2 refills | Status: DC

## 2017-06-14 MED ORDER — DOXYCYCLINE HYCLATE 100 MG PO TABS: 100.0000 mg | ORAL_TABLET | Freq: Two times a day (BID) | ORAL | 0 refills | Status: DC

## 2017-06-14 NOTE — Progress Notes (Signed)
ycyclPatient ID: Anthony [name], male    DOB: 1980-10-14  Age: 37 y.o. MRN: [id number]  The patient is here for annual  preventive examination and management of other chronic and acute problems.   The risk factors are reflected in the social history.  The roster of all physicians providing medical care to patient - is listed in the Snapshot section of the chart.  Activities of daily living:  The patient is 100% independent in all ADLs: dressing, toileting, feeding as well as independent mobility  Home safety : The patient has smoke detectors in the home. They wear seatbelts.  There are no firearms at home. There is no violence in the home.   There is no risks for hepatitis, STDs or HIV. There is no   history of blood transfusion. They have no travel history to infectious disease endemic areas of the world.  The patient has seen their dentist in the last six month. They have seen their eye doctor in the last year. They deny  hearing difficulty with regard to whispered voices and some television programs. They do not  have excessive sun exposure. Discussed the need for sun protection: hats, long sleeves and use of sunscreen if there is significant sun exposure.   Diet: the importance of a healthy diet is discussed. They do have a healthy diet.  The benefits of regular aerobic exercise were discussed. He exercises 60 minutes daily lifting weights    Depression screen: there are no signs or vegative symptoms of depression- irritability, change in appetite, anhedonia, sadness/tearfullness.  The following portions of the patient's history were reviewed and updated as appropriate: allergies, current medications, past family history, past medical history,  past surgical history, past social history  and problem list.  Visual acuity was not assessed per patient preference since she has regular follow up with her ophthalmologist. Hearing and body mass index were assessed and reviewed.   During the  course of the visit the patient was educated and counseled about appropriate screening and preventive services including : fall prevention , diabetes screening, nutrition counseling, colorectal cancer screening, and recommended immunizations.    CC: The primary encounter diagnosis was Low testosterone in male. Diagnoses of Decreased libido, At risk for HIV due to occupational risk factor, Long-term use of high-risk medication, and Encounter for preventive health examination were also pertinent to this visit.  History Tajuan has no past medical history on file.   He has no past surgical history on file.   His family history includes Arthritis in his paternal grandfather; Cancer in his paternal grandfather; Cancer (age of onset: 19) in his paternal uncle; Hearing loss in his paternal uncle; Heart disease in his paternal uncle.He reports that he has never smoked. He has never used smokeless tobacco. He reports that he drinks about 6.0 oz of alcohol per week. He reports that he does not use drugs.  Outpatient Medications Prior to Visit  Medication Sig Dispense Refill  . clomiPHENE (CLOMID) 50 MG tablet TAKE 1 TABLET BY MOUTH EVERY OTHER DAY, OR CUT IT IN 1/2 AND TAKE 1/2 TAB DAILY 15 tablet 2  . fluticasone (FLONASE) 50 MCG/ACT nasal spray Place 2 sprays into both nostrils daily.    . mometasone (ELOCON) 0.1 % ointment APPLY TO AFFECTED AREA EVERY DAY 45 g 3  . oseltamivir (TAMIFLU) 75 MG capsule Take 1 capsule (75 mg total) by mouth 2 (two) times daily. (Patient not taking: Reported on 06/14/2017) 10 capsule 0   No facility-administered medications  prior to visit.     Review of Systems   Patient denies headache, fevers, malaise, unintentional weight loss, skin rash, eye pain, sinus congestion and sinus pain, sore throat, dysphagia,  hemoptysis , cough, dyspnea, wheezing, chest pain, palpitations, orthopnea, edema, abdominal pain, nausea, melena, diarrhea, constipation, flank pain, dysuria,  hematuria, urinary  Frequency, nocturia, numbness, tingling, seizures,  Focal weakness, Loss of consciousness,  Tremor, insomnia, depression, anxiety, and suicidal ideation.      Objective:  BP 130/70 (BP Location: Left Arm, Patient Position: Sitting, Cuff Size: Large)   Pulse 82   Temp 98.4 F (36.9 C) (Oral)   Resp 15   Ht 6\' 2"  (1.88 m)   Wt 224 lb 3.2 oz (101.7 kg)   SpO2 94%   BMI 28.79 kg/m   Physical Exam   General appearance: alert, cooperative and appears stated age Ears: normal TM's and external ear canals both ears Throat: lips, mucosa, and tongue normal; teeth and gums normal Neck: no adenopathy, no carotid bruit, supple, symmetrical, trachea midline and thyroid not enlarged, symmetric, no tenderness/mass/nodules Back: symmetric, no curvature. ROM normal. No CVA tenderness. Lungs: clear to auscultation bilaterally Heart: regular rate and rhythm, S1, S2 normal, no murmur, click, rub or gallop Abdomen: soft, non-tender; bowel sounds normal; no masses,  no organomegaly Pulses: 2+ and symmetric Skin: Skin color, texture, turgor normal. No rashes or lesions Lymph nodes: Cervical, supraclavicular, and axillary nodes normal.    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  Testosterone level is due, as well as PSA , CBC and CMET       Relevant Orders   Testos,Total,Free and SHBG (Male)   Encounter for preventive health examination    Annual comprehensive preventive exam was done as well as an evaluation and management of acute and chronic conditions .  During the course of the visit the patient was educated and counseled about appropriate screening and preventive services including :  diabetes screening, lipid analysis with projected  10 year  risk for CAD , nutrition counseling, prostate and colorectal cancer screening, and recommended immunizations.  Printed  recommendations for health maintenance screenings was given.       At risk for HIV due to occupational risk factor    Annual screening is advised given his contact with criminals and prisoners.      Relevant Orders   HIV antibody   Hepatitis C antibody    Other Visit Diagnoses    Decreased libido       Relevant Medications   clomiPHENE (CLOMID) 50 MG tablet   Long-term use of high-risk medication       Relevant Orders   Comprehensive metabolic panel   Lipid panel   CBC with Differential/Platelet   PSA      I have discontinued Cecilie Lowers Gilkeson's oseltamivir and mometasone. I have also changed his clomiPHENE. Additionally, I am having him start on doxycycline. Lastly, I am having him maintain his fluticasone.  Meds ordered this encounter  Medications  . fluticasone (FLONASE) 50 MCG/ACT nasal spray    Sig: Place 2 sprays into both nostrils daily.    Dispense:  15.8 g    Refill:  11  . clomiPHENE (CLOMID) 50 MG tablet    Sig: 1 tablet every 2 days    Dispense:  15 tablet    Refill:  2  . doxycycline (VIBRA-TABS) 100  MG tablet    Sig: Take 1 tablet (100 mg total) by mouth 2 (two) times daily.    Dispense:  20 tablet    Refill:  0    For tick borne illness    Medications Discontinued During This Encounter  Medication Reason  . oseltamivir (TAMIFLU) 75 MG capsule Completed Course  . fluticasone (FLONASE) 50 MCG/ACT nasal spray Reorder  . clomiPHENE (CLOMID) 50 MG tablet Reorder  . mometasone (ELOCON) 0.1 % ointment     Follow-up: No follow-ups on file.   [name], MD

## 2017-06-14 NOTE — Patient Instructions (Signed)
[phone]  [phone]   Cell phone   Health Maintenance, Male A healthy lifestyle and preventive care is important for your health and wellness. Ask your health care provider about what schedule of regular examinations is right for you. What should I know about weight and diet? Eat a Healthy Diet  Eat plenty of vegetables, fruits, whole grains, low-fat dairy products, and lean protein.  Do not eat a lot of foods high in solid fats, added sugars, or salt.  Maintain a Healthy Weight Regular exercise can help you achieve or maintain a healthy weight. You should:  Do at least 150 minutes of exercise each week. The exercise should increase your heart rate and make you sweat (moderate-intensity exercise).  Do strength-training exercises at least twice a week.  Watch Your Levels of Cholesterol and Blood Lipids  Have your blood tested for lipids and cholesterol every 5 years starting at 39 years of age. If you are at high risk for heart disease, you should start having your blood tested when you are 39 years old. You may need to have your cholesterol levels checked more often if: ? Your lipid or cholesterol levels are high. ? You are older than 39 years of age. ? You are at high risk for heart disease.  What should I know about cancer screening? Many types of cancers can be detected early and may often be prevented. Lung Cancer  You should be screened every year for lung cancer if: ? You are a current smoker who has smoked for at least 30 years. ? You are a former smoker who has quit within the past 15 years.  Talk to your health care provider about your screening options, when you should start screening, and how often you should be screened.  Colorectal Cancer  Routine colorectal cancer screening usually begins at 39 years of age and should be repeated every 5-10 years until you are 39 years old. You may need to be screened more often if early forms of precancerous polyps or small growths are  found. Your health care provider may recommend screening at an earlier age if you have risk factors for colon cancer.  Your health care provider may recommend using home test kits to check for hidden blood in the stool.  A small camera at the end of a tube can be used to examine your colon (sigmoidoscopy or colonoscopy). This checks for the earliest forms of colorectal cancer.  Prostate and Testicular Cancer  Depending on your age and overall health, your health care provider may do certain tests to screen for prostate and testicular cancer.  Talk to your health care provider about any symptoms or concerns you have about testicular or prostate cancer.  Skin Cancer  Check your skin from head to toe regularly.  Tell your health care provider about any new moles or changes in moles, especially if: ? There is a change in a mole's size, shape, or color. ? You have a mole that is larger than a pencil eraser.  Always use sunscreen. Apply sunscreen liberally and repeat throughout the day.  Protect yourself by wearing long sleeves, pants, a wide-brimmed hat, and sunglasses when outside.  What should I know about heart disease, diabetes, and high blood pressure?  If you are 79-77 years of age, have your blood pressure checked every 3-5 years. If you are 40 years of age or older, have your blood pressure checked every year. You should have your blood pressure measured twice-once when  you are at a hospital or clinic, and once when you are not at a hospital or clinic. Record the average of the two measurements. To check your blood pressure when you are not at a hospital or clinic, you can use: ? An automated blood pressure machine at a pharmacy. ? A home blood pressure monitor.  Talk to your health care provider about your target blood pressure.  If you are between 71-90 years old, ask your health care provider if you should take aspirin to prevent heart disease.  Have regular diabetes  screenings by checking your fasting blood sugar level. ? If you are at a normal weight and have a low risk for diabetes, have this test once every three years after the age of 42. ? If you are overweight and have a high risk for diabetes, consider being tested at a younger age or more often.  A one-time screening for abdominal aortic aneurysm (AAA) by ultrasound is recommended for men aged 81-75 years who are current or former smokers. What should I know about preventing infection? Hepatitis B If you have a higher risk for hepatitis B, you should be screened for this virus. Talk with your health care provider to find out if you are at risk for hepatitis B infection. Hepatitis C Blood testing is recommended for:  Everyone born from [birth year] through 1967.  Anyone with known risk factors for hepatitis C.  Sexually Transmitted Diseases (STDs)  You should be screened each year for STDs including gonorrhea and chlamydia if: ? You are sexually active and are younger than 39 years of age. ? You are older than 39 years of age and your health care provider tells you that you are at risk for this type of infection. ? Your sexual activity has changed since you were last screened and you are at an increased risk for chlamydia or gonorrhea. Ask your health care provider if you are at risk.  Talk with your health care provider about whether you are at high risk of being infected with HIV. Your health care provider may recommend a prescription medicine to help prevent HIV infection.  What else can I do?  Schedule regular health, dental, and eye exams.  Stay current with your vaccines (immunizations).  Do not use any tobacco products, such as cigarettes, chewing tobacco, and e-cigarettes. If you need help quitting, ask your health care provider.  Limit alcohol intake to no more than 2 drinks per day. One drink equals 12 ounces of beer, 5 ounces of wine, or 1 ounces of hard liquor.  Do not use street  drugs.  Do not share needles.  Ask your health care provider for help if you need support or information about quitting drugs.  Tell your health care provider if you often feel depressed.  Tell your health care provider if you have ever been abused or do not feel safe at home. This information is not intended to replace advice given to you by your health care provider. Make sure you discuss any questions you have with your health care provider. Document Released: 07/18/2007 Document Revised: 09/18/2015 Document Reviewed: 10/23/2014 Elsevier Interactive Patient Education  [name].

## 2017-06-15 NOTE — Assessment & Plan Note (Signed)
Annual screening is advised given his contact with criminals and prisoners.

## 2017-06-15 NOTE — Assessment & Plan Note (Signed)

## 2017-06-15 NOTE — Assessment & Plan Note (Signed)
Diagnosed and treated with clomiphene.  Testosterone level is due, as well as PSA , CBC and CMET

## 2017-06-23 ENCOUNTER — Telehealth: Payer: Self-pay | Admitting: Internal Medicine

## 2017-06-23 ENCOUNTER — Other Ambulatory Visit (INDEPENDENT_AMBULATORY_CARE_PROVIDER_SITE_OTHER): Payer: Managed Care, Other (non HMO)

## 2017-06-23 DIAGNOSIS — Z79899 Other long term (current) drug therapy: Secondary | ICD-10-CM

## 2017-06-23 DIAGNOSIS — Z9189 Other specified personal risk factors, not elsewhere classified: Secondary | ICD-10-CM

## 2017-06-23 DIAGNOSIS — R7989 Other specified abnormal findings of blood chemistry: Secondary | ICD-10-CM

## 2017-06-23 NOTE — Addendum Note (Signed)
Addended by: Arby Barrette on: 06/23/2017 08:09 AM   Modules accepted: Orders

## 2017-06-27 LAB — CBC WITH DIFFERENTIAL/PLATELET
BASOS PCT: 0.6 %
Basophils Absolute: 40 cells/uL (ref 0–200)
EOS PCT: 1.5 %
Eosinophils Absolute: 99 cells/uL (ref 15–500)
HCT: 47.7 % (ref 38.5–50.0)
Hemoglobin: 16.6 g/dL (ref 13.2–17.1)
LYMPHS ABS: 1769 {cells}/uL (ref 850–3900)
MCH: 30.5 pg (ref 27.0–33.0)
MCHC: 34.8 g/dL (ref 32.0–36.0)
MCV: 87.7 fL (ref 80.0–100.0)
MONOS PCT: 10.2 %
MPV: 11.5 fL (ref 7.5–12.5)
NEUTROS ABS: 4019 {cells}/uL (ref 1500–7800)
Neutrophils Relative %: 60.9 %
Platelets: 155 10*3/uL (ref 140–400)
RBC: 5.44 10*6/uL (ref 4.20–5.80)
RDW: 12.1 % (ref 11.0–15.0)
Total Lymphocyte: 26.8 %
WBC mixed population: 673 cells/uL (ref 200–950)
WBC: 6.6 10*3/uL (ref 3.8–10.8)

## 2017-06-27 LAB — TESTOS,TOTAL,FREE AND SHBG (FEMALE)
FREE TESTOSTERONE: 95.6 pg/mL (ref 35.0–155.0)
Sex Hormone Binding: 46 nmol/L (ref 10–50)
TESTOSTERONE, TOTAL, LC-MS-MS: 824 ng/dL (ref 250–1100)

## 2017-06-27 LAB — LIPID PANEL
CHOL/HDL RATIO: 2.8 (calc) (ref <5.0)
CHOLESTEROL: 139 mg/dL (ref <200)
HDL: 49 mg/dL (ref >40)
LDL CHOLESTEROL (CALC): 74 mg/dL
Non-HDL Cholesterol (Calc): 90 mg/dL (calc) (ref <130)
TRIGLYCERIDES: 78 mg/dL (ref <150)

## 2017-06-27 LAB — COMPREHENSIVE METABOLIC PANEL
AG RATIO: 2.1 (calc) (ref 1.0–2.5)
ALKALINE PHOSPHATASE (APISO): 46 U/L (ref 40–115)
ALT: 32 U/L (ref 9–46)
AST: 25 U/L (ref 10–40)
Albumin: 4.4 g/dL (ref 3.6–5.1)
BILIRUBIN TOTAL: 1.4 mg/dL — AB (ref 0.2–1.2)
BUN/Creatinine Ratio: 21 (calc) (ref 6–22)
BUN: 27 mg/dL — AB (ref 7–25)
CALCIUM: 9.5 mg/dL (ref 8.6–10.3)
CO2: 24 mmol/L (ref 20–32)
Chloride: 103 mmol/L (ref 98–110)
Creat: 1.28 mg/dL (ref 0.60–1.35)
Globulin: 2.1 g/dL (calc) (ref 1.9–3.7)
Glucose, Bld: 93 mg/dL (ref 65–99)
Potassium: 4.4 mmol/L (ref 3.5–5.3)
SODIUM: 136 mmol/L (ref 135–146)
TOTAL PROTEIN: 6.5 g/dL (ref 6.1–8.1)

## 2017-06-27 LAB — HEPATITIS C ANTIBODY
HEP C AB: NONREACTIVE
SIGNAL TO CUT-OFF: 0.02 (ref <1.00)

## 2017-06-27 LAB — HIV ANTIBODY (ROUTINE TESTING W REFLEX): HIV 1&2 Ab, 4th Generation: NONREACTIVE

## 2017-06-27 LAB — PSA: PSA: 0.7 ng/mL (ref <=4.0)

## 2017-07-12 ENCOUNTER — Encounter: Payer: Self-pay | Admitting: Internal Medicine

## 2017-09-24 ENCOUNTER — Other Ambulatory Visit: Payer: Self-pay | Admitting: Internal Medicine

## 2017-09-24 DIAGNOSIS — R6882 Decreased libido: Secondary | ICD-10-CM

## 2017-09-24 NOTE — Telephone Encounter (Signed)
Refilled: 06/14/2017 Last OV: 06/14/2017 Next OV: not scheduled 

## 2017-11-05 ENCOUNTER — Ambulatory Visit: Payer: Managed Care, Other (non HMO) | Admitting: Family Medicine

## 2017-11-05 ENCOUNTER — Encounter: Payer: Self-pay | Admitting: Family Medicine

## 2017-11-05 VITALS — BP 142/96 | HR 92 | Temp 98.6°F | Ht 73.0 in | Wt 205.0 lb

## 2017-11-05 DIAGNOSIS — N50819 Testicular pain, unspecified: Secondary | ICD-10-CM | POA: Diagnosis not present

## 2017-11-05 DIAGNOSIS — R300 Dysuria: Secondary | ICD-10-CM

## 2017-11-05 DIAGNOSIS — Z23 Encounter for immunization: Secondary | ICD-10-CM

## 2017-11-05 DIAGNOSIS — R1032 Left lower quadrant pain: Secondary | ICD-10-CM | POA: Diagnosis not present

## 2017-11-05 LAB — COMPREHENSIVE METABOLIC PANEL
ALBUMIN: 4.4 g/dL (ref 3.5–5.2)
ALT: 40 U/L (ref 0–53)
AST: 28 U/L (ref 0–37)
Alkaline Phosphatase: 45 U/L (ref 39–117)
BUN: 28 mg/dL — AB (ref 6–23)
CHLORIDE: 103 meq/L (ref 96–112)
CO2: 32 mEq/L (ref 19–32)
CREATININE: 1.23 mg/dL (ref 0.40–1.50)
Calcium: 9.9 mg/dL (ref 8.4–10.5)
GFR: 69.57 mL/min (ref >60.00)
Glucose, Bld: 94 mg/dL (ref 70–99)
Potassium: 4.4 mEq/L (ref 3.5–5.1)
SODIUM: 139 meq/L (ref 135–145)
Total Bilirubin: 1.1 mg/dL (ref 0.2–1.2)
Total Protein: 6.7 g/dL (ref 6.0–8.3)

## 2017-11-05 LAB — POCT URINALYSIS DIPSTICK
Bilirubin, UA: NEGATIVE
Glucose, UA: NEGATIVE
Ketones, UA: NEGATIVE
LEUKOCYTES UA: NEGATIVE
NITRITE UA: NEGATIVE
PH UA: 6 (ref 5.0–8.0)
PROTEIN UA: NEGATIVE
RBC UA: NEGATIVE
SPEC GRAV UA: 1.01 (ref 1.010–1.025)
Urobilinogen, UA: 0.2 E.U./dL

## 2017-11-05 LAB — CBC WITH DIFFERENTIAL/PLATELET
Basophils Absolute: 0 10*3/uL (ref 0.0–0.1)
Basophils Relative: 0.5 % (ref 0.0–3.0)
Eosinophils Absolute: 0.1 10*3/uL (ref 0.0–0.7)
Eosinophils Relative: 1.4 % (ref 0.0–5.0)
HCT: 48.6 % (ref 39.0–52.0)
HEMOGLOBIN: 16.5 g/dL (ref 13.0–17.0)
Lymphocytes Relative: 18.8 % (ref 12.0–46.0)
Lymphs Abs: 1.6 10*3/uL (ref 0.7–4.0)
MCHC: 34 g/dL (ref 30.0–36.0)
MCV: 90.6 fl (ref 78.0–100.0)
MONO ABS: 0.7 10*3/uL (ref 0.1–1.0)
Monocytes Relative: 9 % (ref 3.0–12.0)
Neutro Abs: 5.9 10*3/uL (ref 1.4–7.7)
Neutrophils Relative %: 70.3 % (ref 43.0–77.0)
Platelets: 148 10*3/uL — ABNORMAL LOW (ref 150.0–400.0)
RBC: 5.36 Mil/uL (ref 4.22–5.81)
RDW: 12.9 % (ref 11.5–15.5)
WBC: 8.3 10*3/uL (ref 4.0–10.5)

## 2017-11-05 MED ORDER — METRONIDAZOLE 500 MG PO TABS: 500.0000 mg | ORAL_TABLET | Freq: Three times a day (TID) | ORAL | 0 refills | Status: DC

## 2017-11-05 MED ORDER — CIPROFLOXACIN HCL 500 MG PO TABS: 500.0000 mg | ORAL_TABLET | Freq: Two times a day (BID) | ORAL | 0 refills | Status: DC

## 2017-11-05 NOTE — Progress Notes (Signed)
Subjective:    Patient ID: Anthony [name], male    DOB: [birth year], 37 y.o.   MRN: [id number]  HPI  Presents to clinic with burning with urination and ABD pain off and on for about 1 month.   Patient denies any possibility of STD exposure.  Patient denies nausea vomiting or diarrhea.  No fever or chills.  States the pain is located more in the left lower side.  Patient also reports some pain in testicles and occasional pain with ejaculation.   Denies any personal or family history of colon, prostate or testicular cancer.  States he does self testicular exams and has not noticed any changes in the way testicles look or feel.  Patient Active Problem List   Diagnosis Date Noted  . 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 At risk for HIV due to occupational risk factor 06/07/2016  . Low testosterone in male 06/07/2016  . Right rotator cuff tear 06/04/2015  . 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 Labral tear of shoulder 02/28/2014  . H/o Lyme disease 11/06/2012  . Encounter for preventive health examination 11/03/2012  . Subacromial bursitis 01/08/2012   Social History   Tobacco Use  . Smoking status: Never Smoker  . Smokeless tobacco: Never Used  Substance Use Topics  . Alcohol use: Yes    Alcohol/week: 10.0 standard drinks    Types: 10 Cans of beer per week   Review of Systems  Constitutional: Negative for chills, fatigue and fever.  HENT: Negative for congestion, ear pain, sinus pain and sore throat.   Eyes: Negative.   Respiratory: Negative for cough, shortness of breath and wheezing.   Cardiovascular: Negative for chest pain, palpitations and leg swelling.  Gastrointestinal: Positive for abdominal pain, LLQ. No diarrhea, nausea and vomiting.  Genitourinary: +dysuria. +testicle pain and pain with ejaculation Musculoskeletal: Negative for arthralgias and myalgias.  Skin: Negative for color change, pallor and rash.  Neurological: Negative for syncope, light-headedness  and headaches.  Psychiatric/Behavioral: The patient is not nervous/anxious.       Objective:   Physical Exam  Constitutional: He is oriented to person, place, and time. He appears well-nourished. No distress.  HENT:  Head: Normocephalic and atraumatic.  Eyes: EOM are normal. No scleral icterus.  Neck: Neck supple. No tracheal deviation present.  Cardiovascular: Normal rate and regular rhythm.  Pulmonary/Chest: Effort normal and breath sounds normal. No respiratory distress.  Abdominal: Soft. Bowel sounds are normal. He exhibits no mass. There is tenderness.  Mild LLQ tenderness with palpation. Patient declines for me to do testicular exam in clinic today.   Musculoskeletal: He exhibits no edema.  Gait normal  Neurological: He is alert and oriented to person, place, and time.  Skin: Skin is warm and dry. No rash noted. He is not diaphoretic.  Psychiatric: He has a normal mood and affect. His behavior is normal.  Nursing note and vitals reviewed.  Vitals:   11/05/17 0859  BP: (!) 142/96  Pulse: 92  Temp: 98.6 F (37 C)  SpO2: 96%      Assessment & Plan:    LLQ ABD pain -- due to location of abdominal pain I will treat as a mild diverticulitis with Cipro & Flagyl course.  Patient will also get CT scan of abdomen pelvis to rule out any other possible abdominal etiology.  CBC and CMP drawn in clinic today.  Burning with urination - urinalysis is clear.  Urine culture also collected and sent to lab.  Pain in testicle/pain with  ejaculation - we will get ultrasound of scrotum to rule out any anatomic abnormalities.  Discussed with patient that if he continues to have the symptoms, we can consider urology referral.  Cipro course will also cover prostatitis infection and a urinary tract infection if these are possible culprits of pain.  Results of imaging and lab work were determine next step in plan of care, patient aware that if pain worsens to call office right away and/or go to the  emergency department.  Flu vaccine given

## 2017-11-06 LAB — URINE CULTURE
MICRO NUMBER:: 91195573
Result:: NO GROWTH
SPECIMEN QUALITY:: ADEQUATE

## 2017-11-09 ENCOUNTER — Other Ambulatory Visit: Payer: Self-pay | Admitting: Family Medicine

## 2017-11-09 DIAGNOSIS — N50819 Testicular pain, unspecified: Secondary | ICD-10-CM

## 2017-11-09 NOTE — Progress Notes (Signed)
US scrotum

## 2017-11-15 ENCOUNTER — Ambulatory Visit
Admission: RE | Admit: 2017-11-15 | Discharge: 2017-11-15 | Disposition: A | Discharge status: Home / Self Care | Payer: Managed Care, Other (non HMO) | Source: Ambulatory Visit | Attending: Family Medicine | Admitting: Family Medicine

## 2017-11-15 DIAGNOSIS — R1032 Left lower quadrant pain: Secondary | ICD-10-CM | POA: Diagnosis not present

## 2017-11-15 DIAGNOSIS — N50819 Testicular pain, unspecified: Secondary | ICD-10-CM | POA: Diagnosis not present

## 2017-11-15 MED ORDER — IOPAMIDOL (ISOVUE-300) INJECTION 61%
100.0000 mL | Freq: Once | INTRAVENOUS | Status: AC | PRN
  Administered 2017-11-15: 100 mL via INTRAVENOUS

## 2017-11-22 DIAGNOSIS — N50819 Testicular pain, unspecified: Secondary | ICD-10-CM

## 2017-11-23 NOTE — Telephone Encounter (Signed)
Urology referral is in

## 2017-11-29 ENCOUNTER — Ambulatory Visit (INDEPENDENT_AMBULATORY_CARE_PROVIDER_SITE_OTHER): Payer: Managed Care, Other (non HMO) | Admitting: Urology

## 2017-11-29 ENCOUNTER — Encounter: Payer: Self-pay | Admitting: Urology

## 2017-11-29 VITALS — BP 135/82 | HR 93 | Ht 73.0 in | Wt 207.0 lb

## 2017-11-29 DIAGNOSIS — N5312 Painful ejaculation: Secondary | ICD-10-CM | POA: Diagnosis not present

## 2017-11-29 DIAGNOSIS — N50819 Testicular pain, unspecified: Secondary | ICD-10-CM

## 2017-11-29 DIAGNOSIS — R300 Dysuria: Secondary | ICD-10-CM

## 2017-11-29 LAB — URINALYSIS, COMPLETE
Bilirubin, UA: NEGATIVE
Glucose, UA: NEGATIVE
Ketones, UA: NEGATIVE
LEUKOCYTES UA: NEGATIVE
Nitrite, UA: NEGATIVE
PH UA: 6 (ref 5.0–7.5)
PROTEIN UA: NEGATIVE
RBC, UA: NEGATIVE
Specific Gravity, UA: 1.01 (ref 1.005–1.030)
Urobilinogen, Ur: 0.2 mg/dL (ref 0.2–1.0)

## 2017-11-29 LAB — BLADDER SCAN AMB NON-IMAGING: SCAN RESULT: 15

## 2017-11-29 NOTE — Progress Notes (Signed)
11/29/2017 3:42 PM   Anthony [name] 02/19/1980 [id number]  Referring provider: [name], MD [place] [place], [place] [address]  Chief Complaint  Patient presents with  . Testicle Pain    HPI: Patient is a 37 year old Caucasian male referred by Jodelle Green, NP for testicular pain.  He presented to his PCP's office on 11/05/2017 complaining of one month of dysuria, pain with ejaculation and LLQ pain radiating into the testicle.  Contrast CT revealed symmetric enhancement of the bilateral kidneys. No definite renal stones on this postcontrast examination. No discrete renal lesions. No urinary obstruction or perinephric stranding. Normal appearance of the urinary bladder given degree distention.  Normal appearance the prostate gland. No free fluid in the pelvic cul-de-sac.  Large colonic stool burden, particularly within the rectal vault, without evidence of enteric obstruction. Otherwise, no explanation for patient's persistent left lower quadrant abdominal pain.  Scrotal ultrasound negative scrotal ultrasound with Doppler. No testicular mass or torsion.   Labs at the PCP:  UA dip negative, WBC count 8.3 and serum creatinine 1.23.     Meds given by PCP:  Flagyl and Cipro  Prior urological history:  None   Current NSAID/anticoagulation:   None   Today, he is still having suprapubic pressure and left testicular pain.  It feels like someone is squeezing his testicle.  He is having an uncomfortable feeling during urination and ejaculation.  No blood in urine or ejaculate fluid.   He has baseline nocturia.  Patient denies any flank pain.  Patient denies any fevers, chills, nausea or vomiting.  He states he first noticed this after a hard work out in [employer].   No penile discharge.    His UA is negative.  His PVR is 15 mL.     PMH: No past medical history on file.  Surgical History: Past Surgical History:  Procedure Laterality Date  . NASAL SINUS SURGERY   2008    Home Medications:  Allergies as of 11/29/2017      Reactions   Erythromycin       Medication List        Accurate as of 11/29/17  3:42 PM. Always use your most recent med list.          ciprofloxacin 500 MG tablet Commonly known as:  CIPRO Take 1 tablet (500 mg total) by mouth 2 (two) times daily.   clomiPHENE 50 MG tablet Commonly known as:  CLOMID TAKE 1 TABLET BY MOUTH EVERY 2 DAYS   doxycycline 100 MG tablet Commonly known as:  VIBRA-TABS Take 1 tablet (100 mg total) by mouth 2 (two) times daily.   fluticasone 50 MCG/ACT nasal spray Commonly known as:  FLONASE Place 2 sprays into both nostrils daily.   metroNIDAZOLE 500 MG tablet Commonly known as:  FLAGYL Take 1 tablet (500 mg total) by mouth 3 (three) times daily.       Allergies:  Allergies  Allergen Reactions  . Erythromycin     Family History: Family History  Problem Relation Age of Onset  . Cancer Paternal Uncle 101       prostate ca  . Heart disease Paternal Uncle        died of MI  . Hearing loss Paternal Uncle   . Cancer Paternal Grandfather        aplastic anemia?   Marland Kitchen Arthritis Paternal Grandfather        temporal arteritis    Social History:  reports  that he has never smoked. He has never used smokeless tobacco. He reports that he drinks about 10.0 standard drinks of alcohol per week. He reports that he does not use drugs.  ROS: UROLOGY Frequent Urination?: No Hard to postpone urination?: No Burning/pain with urination?: Yes Get up at night to urinate?: Yes Leakage of urine?: No Urine stream starts and stops?: No Trouble starting stream?: No Do you have to strain to urinate?: No Blood in urine?: No Urinary tract infection?: No Sexually transmitted disease?: No Injury to kidneys or bladder?: No Painful intercourse?: Yes Weak stream?: No Erection problems?: No Penile pain?: No  Gastrointestinal Nausea?: No Vomiting?: No Indigestion/heartburn?: No Diarrhea?:  No Constipation?: No  Constitutional Fever: No Night sweats?: No Weight loss?: No Fatigue?: No  Skin Skin rash/lesions?: No Itching?: No  Eyes Blurred vision?: No Double vision?: No  Ears/Nose/Throat Sore throat?: No Sinus problems?: No  Hematologic/Lymphatic Swollen glands?: No Easy bruising?: No  Cardiovascular Leg swelling?: No Chest pain?: No  Respiratory Cough?: No Shortness of breath?: No  Endocrine Excessive thirst?: No  Musculoskeletal Back pain?: No Joint pain?: No  Neurological Headaches?: No Dizziness?: No  Psychologic Depression?: No Anxiety?: No  Physical Exam: BP 135/82 (BP Location: Left Arm, Patient Position: Sitting, Cuff Size: Normal)   Pulse 93   Ht 6\' 1"  (1.854 m)   Wt 207 lb (93.9 kg)   BMI 27.31 kg/m   Constitutional:  Well nourished. Alert and oriented, No acute distress. HEENT: [place] AT, moist mucus membranes.  Trachea midline, no masses. Cardiovascular: No clubbing, cyanosis, or edema. Respiratory: Normal respiratory effort, no increased work of breathing. GI: Abdomen is soft, non tender, non distended, no abdominal masses. Liver and spleen not palpable.  No hernias appreciated.  Stool sample for occult testing is not indicated.   GU: No CVA tenderness.  No bladder fullness or masses.  Patient with circumcised phallus.  Urethral meatus is patent.  No penile discharge. No penile lesions or rashes. Scrotum without lesions, cysts, rashes and/or edema.  Testicles are located scrotally bilaterally. No masses are appreciated in the testicles. Left and right epididymis are normal. Rectal: Patient with  normal sphincter tone. Anus and perineum without scarring or rashes. No rectal masses are appreciated. Prostate is approximately 45 grams, no nodules are appreciated. Seminal vesicles are normal. Skin: No rashes, bruises or suspicious lesions. Lymph: No cervical or inguinal adenopathy. Neurologic: Grossly intact, no focal deficits, moving  all 4 extremities. Psychiatric: Normal mood and affect.  Laboratory Data: Lab Results  Component Value Date   WBC 8.3 11/05/2017   HGB 16.5 11/05/2017   HCT 48.6 11/05/2017   MCV 90.6 11/05/2017   PLT 148.0 (L) 11/05/2017    Lab Results  Component Value Date   CREATININE 1.23 11/05/2017    Lab Results  Component Value Date   PSA 0.7 06/23/2017   PSA 0.56 07/25/2015    Lab Results  Component Value Date   TESTOSTERONE 751 03/02/2016    No results found for: HGBA1C  Lab Results  Component Value Date   TSH 1.42 07/25/2015       Component Value Date/Time   CHOL 139 06/23/2017 0809   HDL 49 06/23/2017 0809   CHOLHDL 2.8 06/23/2017 0809   VLDL 14.2 07/25/2015 0918   LDLCALC 74 06/23/2017 0809    Lab Results  Component Value Date   AST 28 11/05/2017   Lab Results  Component Value Date   ALT 40 11/05/2017   No components found for:  ALKALINEPHOPHATASE No components found for: BILIRUBINTOTAL  No results found for: ESTRADIOL  Urinalysis Negative.  See Epic.  I have reviewed the labs.   Pertinent Imaging: CLINICAL DATA:  Left lower quadrant abdominal pain for the past 3 months.  EXAM: CT ABDOMEN AND PELVIS WITH CONTRAST  TECHNIQUE: Multidetector CT imaging of the abdomen and pelvis was performed using the standard protocol following bolus administration of intravenous contrast.  CONTRAST:  183mL ISOVUE-300 IOPAMIDOL (ISOVUE-300) INJECTION 61%  COMPARISON:  CT abdomen pelvis-07/04/2014  FINDINGS: Lower chest: Limited visualization of the lower thorax demonstrates approximately 0.8 cm ground-glass nodule within the subpleural aspect the right lower lobe (image 4, series 4), unchanged compared to the 07/2014 examination and thus of benign etiology. No focal airspace opacities. No pleural effusion.  Normal heart size.  No pericardial effusion.  Hepatobiliary: Normal hepatic contour. No discrete hepatic lesions. Normal appearance of the  gallbladder given degree distention. No radiopaque gallstones. No intra extrahepatic bili duct dilatation. No ascites.  Pancreas: Normal appearance of the pancreas  Spleen: Normal appearance of the spleen. The splenic flexure of the colon is noted to be interposed posterior to the spleen. No perisplenic stranding.  Adrenals/Urinary Tract: There is symmetric enhancement of the bilateral kidneys. No definite renal stones on this postcontrast examination. No discrete renal lesions. No urinary obstruction or perinephric stranding. Normal appearance of the urinary bladder given degree distention.  Normal appearance the bilateral adrenal glands.  Stomach/Bowel: Large colonic stool burden, particularly within the rectal vault, without evidence of enteric obstruction. Normal appearance of the terminal ileum. Normal appearance of the appendix. No pneumoperitoneum, pneumatosis or portal venous gas.  Vascular/Lymphatic: Normal caliber the abdominal aorta. The major branch vessels of the abdominal aorta appear patent on this non CTA examination.  No bulky retroperitoneal, mesenteric, pelvic or inguinal lymphadenopathy.  Reproductive: Normal appearance the prostate gland. No free fluid in the pelvic cul-de-sac.  Other: Regional soft tissues appear normal.  Musculoskeletal: No acute or aggressive osseous abnormalities. Punctate bone island within the right femoral neck.  IMPRESSION: Large colonic stool burden, particularly within the rectal vault, without evidence of enteric obstruction. Otherwise, no explanation for patient's persistent left lower quadrant abdominal pain   Electronically Signed   By: Sandi Mariscal M.D.   On: 11/15/2017 15:23  CLINICAL DATA:  37 year old male with testicular pain for 1-2 months.  EXAM: SCROTAL ULTRASOUND  DOPPLER ULTRASOUND OF THE TESTICLES  TECHNIQUE: Complete ultrasound examination of the testicles, epididymis, and other  scrotal structures was performed. Color and spectral Doppler ultrasound were also utilized to evaluate blood flow to the testicles.  COMPARISON:  CT Abdomen and Pelvis 1303 hours today.  FINDINGS: Right testicle  Measurements: 5.4 x 2.1 x 2.9 centimeters. No mass or microlithiasis visualized.  Left testicle  Measurements: 5.2 x 2.3 x 3.3 centimeters. No mass or microlithiasis visualized.  Right epididymis:  Normal in size and appearance.  Left epididymis:  Normal in size and appearance.  Hydrocele:  None visualized.  Varicocele:  None visualized.  Pulsed Doppler interrogation of both testes demonstrates normal low resistance arterial and venous waveforms bilaterally.  IMPRESSION: Negative scrotal ultrasound with Doppler. No testicular mass or torsion.   Electronically Signed   By: Genevie Ann M.D.   On: 11/15/2017 17:26 Results for REUVEN, BRAVER (MRN [id number]) as of 11/29/2017 16:45  Ref. Range 11/29/2017 15:10  Scan Result Unknown 15    I have independently reviewed the films.    Assessment & Plan:    1. Testicular pain  Likely referred as CT and [place] and exam were normal - did note constipation on CT  He did notice it the evening after a hard work out - may be a psoas muscle injury  2. Dysuria UA will be sent for culture to rule out indolent infection Will consider a cystoscopy if urine culture is negative I have explained to the patient that they will  be scheduled for a cystoscopy in our office to evaluate their bladder.  The cystoscopy consists of passing a tube with a lens up through their urethra and into their urinary bladder.   We will inject the urethra with a lidocaine gel prior to introducing the cystoscope to help with any discomfort during the procedure.   After the procedure, they might experience blood in the urine and discomfort with urination.  This will abate after the first few voids.  I have  encouraged the patient to increase water  intake  during this time.  Patient denies any allergies to lidocaine.   3. Painful ejaculation See above   Return for pending urine culture results .  These notes generated with voice recognition software. I apologize for typographical errors.  Anthony Council, PA-C  [facility] [address]  [place],  [address] [phone]

## 2017-11-30 LAB — GC/CHLAMYDIA PROBE AMP
CHLAMYDIA, DNA PROBE: NEGATIVE
NEISSERIA GONORRHOEAE BY PCR: NEGATIVE

## 2017-12-01 LAB — CULTURE, URINE COMPREHENSIVE

## 2017-12-27 ENCOUNTER — Encounter: Payer: Self-pay | Admitting: Urology

## 2017-12-27 ENCOUNTER — Other Ambulatory Visit: Payer: Self-pay

## 2017-12-27 ENCOUNTER — Ambulatory Visit (INDEPENDENT_AMBULATORY_CARE_PROVIDER_SITE_OTHER): Payer: Managed Care, Other (non HMO) | Admitting: Urology

## 2017-12-27 VITALS — BP 153/81 | HR 85 | Wt 200.0 lb

## 2017-12-27 DIAGNOSIS — R300 Dysuria: Secondary | ICD-10-CM

## 2017-12-27 LAB — URINALYSIS, COMPLETE
BILIRUBIN UA: NEGATIVE
GLUCOSE, UA: NEGATIVE
Ketones, UA: NEGATIVE
Leukocytes, UA: NEGATIVE
NITRITE UA: NEGATIVE
PROTEIN UA: NEGATIVE
RBC, UA: NEGATIVE
SPEC GRAV UA: 1.02 (ref 1.005–1.030)
UUROB: 0.2 mg/dL (ref 0.2–1.0)
pH, UA: 7 (ref 5.0–7.5)

## 2017-12-27 MED ORDER — TAMSULOSIN HCL 0.4 MG PO CAPS: 0.4000 mg | ORAL_CAPSULE | Freq: Every day | ORAL | 3 refills | Status: DC

## 2017-12-27 NOTE — Progress Notes (Signed)
Cystoscopy Procedure Note:  Indication: rule out urethral stricture  After informed consent and discussion of the procedure and its risks, Anthony [name] was positioned and prepped in the standard fashion. Cystoscopy was performed with a flexible cystoscope. The urethra, bladder neck and entire bladder was visualized in a standard fashion. The urethra was widely patent.The prostate was short. The ureteral orifices were visualized in their normal location and orientation. Bladder mucosa grossly normal.  Findings: Normal cystoscopy  Assessment and Plan: We discussed at length his symptoms- Primarily are pain with orgasm, and achy testicular pain as well as overall pelvic floor discomfort.  -Trial of Flomax nightly, we discussed effects including retrograde ejaculation -Recommended 2-3 weeks of scheduled anti-inflammatories -Follow-up in 6 weeks to discuss symptoms, consider referral to pelvic floor physical therapy if ongoing symptoms at that time  Anthony Madrid, MD 12/27/2017

## 2018-01-18 ENCOUNTER — Ambulatory Visit: Payer: Managed Care, Other (non HMO) | Admitting: Internal Medicine

## 2018-01-18 ENCOUNTER — Encounter: Payer: Self-pay | Admitting: Internal Medicine

## 2018-01-18 VITALS — BP 138/86 | HR 100 | Temp 98.2°F | Wt 226.1 lb

## 2018-01-18 DIAGNOSIS — Z8249 Family history of ischemic heart disease and other diseases of the circulatory system: Secondary | ICD-10-CM | POA: Diagnosis not present

## 2018-01-18 DIAGNOSIS — R1031 Right lower quadrant pain: Secondary | ICD-10-CM | POA: Insufficient documentation

## 2018-01-18 DIAGNOSIS — R000 Tachycardia, unspecified: Secondary | ICD-10-CM

## 2018-01-18 DIAGNOSIS — I10 Essential (primary) hypertension: Secondary | ICD-10-CM | POA: Insufficient documentation

## 2018-01-18 MED ORDER — PEG 3350-KCL-NABCB-NACL-NASULF 236 G PO SOLR: 4000.0000 mL | Freq: Once | ORAL | 0 refills | Status: AC

## 2018-01-18 MED ORDER — METOPROLOL SUCCINATE ER 25 MG PO TB24: 25.0000 mg | ORAL_TABLET | Freq: Every day | ORAL | 3 refills | Status: DC

## 2018-01-18 NOTE — Patient Instructions (Addendum)
I think  your lower abdominal pain is due to constipation . I recommend drinking Go lytely until you have completely evacuated your bowels , then start a daily regimen of Citrucel    I am starting you on Topro l XL  For your blood pressure and heart rate. Take it once daily in the evenings.   You need to increase your water intake and add a little gatorade daily    Referral to [facility] is in progress to evaluate your heart .  I will order the cardiac CT in advance of your appointment

## 2018-01-18 NOTE — Assessment & Plan Note (Signed)
Unclear whether his brother had ischemic cardiomyopathy or  An alternative diagnosis. Coronary CT ordered along with cardiology consult

## 2018-01-18 NOTE — Assessment & Plan Note (Signed)
12 lead EKG reviewed today  He is in NSR metoprolol prescribed

## 2018-01-18 NOTE — Assessment & Plan Note (Signed)
Presumed secondary to IBS/constipation given normal CT abdomen and normal testicular ultrasound. Go lytely,  Then citrucel

## 2018-01-18 NOTE — Assessment & Plan Note (Signed)
starting metoprolol  For control of palpitations as well

## 2018-01-18 NOTE — Progress Notes (Signed)
Subjective:  Patient ID: Anthony [name], male    DOB: 08-16-1980  Age: 37 y.o. MRN: [id number]  CC: The primary encounter diagnosis was Tachycardia. Diagnoses of Family history of early CAD, Family history of cardiomyopathy, Essential hypertension, and Right lower quadrant pain were also pertinent to this visit.  HPI Anthony [name] presents for follow up on multiple syptoms and elevated blood pressure readings, palpitations,    His 58 yr old brother suffered a cardiac arrest last month  at age of 89 after presenting to ER in atrial fib and heart failure .  He had a history of hypertension,  Not optimally treated.  He did not survive the code .   Patient is  anxious today,  BP is elevated and has been elevated at home as well at times.  He works out vigorously 5 to 6 days per week at a [employer] and has had no episodes of chest pain , jaw pain or arm pain.  He does note episodes of palpitations and feels woozy when he stands up suddenly   Getting orthostatic when he stands up    Outpatient Medications Prior to Visit  Medication Sig Dispense Refill  . fluticasone (FLONASE) 50 MCG/ACT nasal spray Place 2 sprays into both nostrils daily. 15.8 g 11  . tamsulosin (FLOMAX) 0.4 MG CAPS capsule Take 1 capsule (0.4 mg total) by mouth daily. 30 capsule 3   No facility-administered medications prior to visit.     Review of Systems;  Patient denies headache, fevers, malaise, unintentional weight loss, skin rash, eye pain, sinus congestion and sinus pain, sore throat, dysphagia,  hemoptysis , cough, dyspnea, wheezing, chest pain, palpitations, orthopnea, edema, abdominal pain, nausea, melena, diarrhea, constipation, flank pain, dysuria, hematuria, urinary  Frequency, nocturia, numbness, tingling, seizures,  Focal weakness, Loss of consciousness,  Tremor, insomnia, depression,  and suicidal ideation.      Objective:  BP 138/86 (BP Location: Left Arm, Patient Position: Sitting, Cuff Size: Large)    Pulse 100   Temp 98.2 F (36.8 C)   Wt 226 lb 1.9 oz (102.6 kg)   SpO2 98%   BMI 29.83 kg/m   BP Readings from Last 3 Encounters:  01/18/18 138/86  12/27/17 (!) 153/81  11/29/17 135/82    Wt Readings from Last 3 Encounters:  01/18/18 226 lb 1.9 oz (102.6 kg)  12/27/17 200 lb (90.7 kg)  11/29/17 207 lb (93.9 kg)    General appearance: alert, anxious. cooperative and appears stated age Ears: normal TM's and external ear canals both ears Throat: lips, mucosa, and tongue normal; teeth and gums normal Neck: no adenopathy, no carotid bruit, supple, symmetrical, trachea midline and thyroid not enlarged, symmetric, no tenderness/mass/nodules Back: symmetric, no curvature. ROM normal. No CVA tenderness. Lungs: clear to auscultation bilaterally Heart: regular rate and rhythm, S1, S2 normal, no murmur, click, rub or gallop Abdomen: soft, non-tender; bowel sounds normal; no masses,  no organomegaly Pulses: 2+ and symmetric Skin: Skin color, texture, turgor normal. No rashes or lesions Lymph nodes: Cervical, supraclavicular, and axillary nodes normal.  No results found for: HGBA1C  Lab Results  Component Value Date   CREATININE 1.23 11/05/2017   CREATININE 1.28 06/23/2017   CREATININE 1.05 07/25/2015    Lab Results  Component Value Date   WBC 8.3 11/05/2017   HGB 16.5 11/05/2017   HCT 48.6 11/05/2017   PLT 148.0 (L) 11/05/2017   GLUCOSE 94 11/05/2017   CHOL 139 06/23/2017   TRIG 78 06/23/2017   HDL  49 06/23/2017   LDLCALC 74 06/23/2017   ALT 40 11/05/2017   AST 28 11/05/2017   NA 139 11/05/2017   K 4.4 11/05/2017   CL 103 11/05/2017   CREATININE 1.23 11/05/2017   BUN 28 (H) 11/05/2017   CO2 32 11/05/2017   TSH 1.42 07/25/2015   PSA 0.7 06/23/2017   MICROALBUR <0.7 07/25/2015    Ct Abdomen Pelvis W Contrast  Result Date: 11/15/2017 CLINICAL DATA:  Left lower quadrant abdominal pain for the past 3 months. EXAM: CT ABDOMEN AND PELVIS WITH CONTRAST TECHNIQUE:  Multidetector CT imaging of the abdomen and pelvis was performed using the standard protocol following bolus administration of intravenous contrast. CONTRAST:  144mL ISOVUE-300 IOPAMIDOL (ISOVUE-300) INJECTION 61% COMPARISON:  CT abdomen pelvis-07/04/2014 FINDINGS: Lower chest: Limited visualization of the lower thorax demonstrates approximately 0.8 cm ground-glass nodule within the subpleural aspect the right lower lobe (image 4, series 4), unchanged compared to the 07/2014 examination and thus of benign etiology. No focal airspace opacities. No pleural effusion. Normal heart size.  No pericardial effusion. Hepatobiliary: Normal hepatic contour. No discrete hepatic lesions. Normal appearance of the gallbladder given degree distention. No radiopaque gallstones. No intra extrahepatic bili duct dilatation. No ascites. Pancreas: Normal appearance of the pancreas Spleen: Normal appearance of the spleen. The splenic flexure of the colon is noted to be interposed posterior to the spleen. No perisplenic stranding. Adrenals/Urinary Tract: There is symmetric enhancement of the bilateral kidneys. No definite renal stones on this postcontrast examination. No discrete renal lesions. No urinary obstruction or perinephric stranding. Normal appearance of the urinary bladder given degree distention. Normal appearance the bilateral adrenal glands. Stomach/Bowel: Large colonic stool burden, particularly within the rectal vault, without evidence of enteric obstruction. Normal appearance of the terminal ileum. Normal appearance of the appendix. No pneumoperitoneum, pneumatosis or portal venous gas. Vascular/Lymphatic: Normal caliber the abdominal aorta. The major branch vessels of the abdominal aorta appear patent on this non CTA examination. No bulky retroperitoneal, mesenteric, pelvic or inguinal lymphadenopathy. Reproductive: Normal appearance the prostate gland. No free fluid in the pelvic cul-de-sac. Other: Regional soft tissues  appear normal. Musculoskeletal: No acute or aggressive osseous abnormalities. Punctate bone island within the right femoral neck. IMPRESSION: Large colonic stool burden, particularly within the rectal vault, without evidence of enteric obstruction. Otherwise, no explanation for patient's persistent left lower quadrant abdominal pain Electronically Signed   By: Sandi Mariscal M.D.   On: 11/15/2017 15:23   US Scrotum W/doppler  Result Date: 11/15/2017 CLINICAL DATA:  37 year old male with testicular pain for 1-2 months. EXAM: SCROTAL ULTRASOUND DOPPLER ULTRASOUND OF THE TESTICLES TECHNIQUE: Complete ultrasound examination of the testicles, epididymis, and other scrotal structures was performed. Color and spectral Doppler ultrasound were also utilized to evaluate blood flow to the testicles. COMPARISON:  CT Abdomen and Pelvis 1303 hours today. FINDINGS: Right testicle Measurements: 5.4 x 2.1 x 2.9 centimeters. No mass or microlithiasis visualized. Left testicle Measurements: 5.2 x 2.3 x 3.3 centimeters. No mass or microlithiasis visualized. Right epididymis:  Normal in size and appearance. Left epididymis:  Normal in size and appearance. Hydrocele:  None visualized. Varicocele:  None visualized. Pulsed Doppler interrogation of both testes demonstrates normal low resistance arterial and venous waveforms bilaterally. IMPRESSION: Negative scrotal ultrasound with Doppler. No testicular mass or torsion. Electronically Signed   By: Genevie Ann M.D.   On: 11/15/2017 17:26    Assessment & Plan:   Problem List Items Addressed This Visit    Family history of cardiomyopathy  Unclear whether his brother had ischemic cardiomyopathy or  An alternative diagnosis. Coronary CT ordered along with cardiology consult       Relevant Orders   Ambulatory referral to Cardiology   CT CARDIAC SCORING   Hypertension    starting metoprolol  For control of palpitations as well      Relevant Medications   metoprolol succinate  (TOPROL-XL) 25 MG 24 hr tablet   Right lower quadrant pain    Presumed secondary to IBS/constipation given normal CT abdomen and normal testicular ultrasound. Go lytely,  Then citrucel      Tachycardia - Primary    12 lead EKG reviewed today  He is in NSR metoprolol prescribed       Relevant Orders   EKG 12-Lead (Completed)   Basic metabolic panel   CBC with Differential/Platelet   Magnesium    Other Visit Diagnoses    Family history of early CAD       Relevant Orders   Lipid panel   CT CARDIAC SCORING    A total of 40 minutes was spent with patient more than half of which was spent in counseling patient on the above mentioned issues , reviewing and explaining recent labs and imaging studies done, and coordination of care.  I am having Randell Loop start on metoprolol succinate and polyethylene glycol. I am also having him maintain his fluticasone and tamsulosin.  Meds ordered this encounter  Medications  . metoprolol succinate (TOPROL-XL) 25 MG 24 hr tablet    Sig: Take 1 tablet (25 mg total) by mouth daily.    Dispense:  90 tablet    Refill:  3  . polyethylene glycol (GOLYTELY) 236 g solution    Sig: Take 4,000 mLs by mouth once for 1 dose. Drink 8 ounces ery 30 minutes   until stool runs clear utes    Dispense:  4000 mL    Refill:  0    There are no discontinued medications.  Follow-up: No follow-ups on file.   [name], MD

## 2018-01-19 LAB — BASIC METABOLIC PANEL
BUN: 25 mg/dL — ABNORMAL HIGH (ref 6–23)
CALCIUM: 9.6 mg/dL (ref 8.4–10.5)
CO2: 30 meq/L (ref 19–32)
Chloride: 103 mEq/L (ref 96–112)
Creatinine, Ser: 1.24 mg/dL (ref 0.40–1.50)
GFR: 68.85 mL/min (ref >60.00)
Glucose, Bld: 104 mg/dL — ABNORMAL HIGH (ref 70–99)
Potassium: 4.1 mEq/L (ref 3.5–5.1)
SODIUM: 139 meq/L (ref 135–145)

## 2018-01-19 LAB — CBC WITH DIFFERENTIAL/PLATELET
BASOS ABS: 0 10*3/uL (ref 0.0–0.1)
Basophils Relative: 0.5 % (ref 0.0–3.0)
Eosinophils Absolute: 0.1 10*3/uL (ref 0.0–0.7)
Eosinophils Relative: 2.2 % (ref 0.0–5.0)
HEMATOCRIT: 50.3 % (ref 39.0–52.0)
Hemoglobin: 17.2 g/dL — ABNORMAL HIGH (ref 13.0–17.0)
LYMPHS PCT: 25.6 % (ref 12.0–46.0)
Lymphs Abs: 1.7 10*3/uL (ref 0.7–4.0)
MCHC: 34.3 g/dL (ref 30.0–36.0)
MCV: 90.7 fl (ref 78.0–100.0)
MONOS PCT: 10.4 % (ref 3.0–12.0)
Monocytes Absolute: 0.7 10*3/uL (ref 0.1–1.0)
NEUTROS ABS: 4 10*3/uL (ref 1.4–7.7)
Neutrophils Relative %: 61.3 % (ref 43.0–77.0)
Platelets: 153 10*3/uL (ref 150.0–400.0)
RBC: 5.54 Mil/uL (ref 4.22–5.81)
RDW: 13.2 % (ref 11.5–15.5)
WBC: 6.5 10*3/uL (ref 4.0–10.5)

## 2018-01-19 LAB — LIPID PANEL
CHOL/HDL RATIO: 3
Cholesterol: 167 mg/dL (ref 0–200)
HDL: 51.1 mg/dL (ref >39.00)
LDL CALC: 95 mg/dL (ref 0–99)
NonHDL: 116.38
TRIGLYCERIDES: 106 mg/dL (ref 0.0–149.0)
VLDL: 21.2 mg/dL (ref 0.0–40.0)

## 2018-01-19 LAB — MAGNESIUM: Magnesium: 2.2 mg/dL (ref 1.5–2.5)

## 2018-02-09 ENCOUNTER — Ambulatory Visit (INDEPENDENT_AMBULATORY_CARE_PROVIDER_SITE_OTHER)
Admission: RE | Admit: 2018-02-09 | Discharge: 2018-02-09 | Disposition: A | Discharge status: Home / Self Care | Payer: Self-pay | Source: Ambulatory Visit | Attending: Internal Medicine | Admitting: Internal Medicine

## 2018-02-09 DIAGNOSIS — Z8249 Family history of ischemic heart disease and other diseases of the circulatory system: Secondary | ICD-10-CM

## 2018-02-10 ENCOUNTER — Ambulatory Visit: Payer: Managed Care, Other (non HMO) | Admitting: Urology

## 2018-02-15 ENCOUNTER — Other Ambulatory Visit: Payer: Self-pay | Admitting: Internal Medicine

## 2018-03-28 NOTE — Progress Notes (Signed)
Cardiology Office Note  Date:  03/29/2018   ID:  Anthony [name], DOB: November 07, 1980, MRN: [id number]  PCP:  [name], MD   Chief Complaint  Patient presents with  . other    Family Hx Cardiomyopathy c/o anxiety, sob and irregular heart beat. Meds reviewed verbally with pt.    HPI:  [name] is a 38 y.o. male with a PMHx of: HTN Tachycardia Non Smoker Family history of Cardiomyopathy He is referred by his [name], MD for consultation of his family history of Cardiomyopathy, sudden passing of his brother age 80  INTERVAL HISTORY: The patient reports today for an initial visit. The patient's brother, who was 28 years old, suddenly passed away in 01-27-18, from PEA cardiac arrest.  Full details unavailable.  His brother had a history of HTN and seemed to always have flu like symptoms. The patient adds his brother was sick prior to his death. His brother had hypertensive arthrosclerotic heart disease and cardiomyopathy. His brother never smoked, but did drink.   He is accompanied by his wife, who is very pregnant with their first child, a daughter. The patient reports normal blood pressure, however, it was elevated at his last visit with his PCP. He is borrowing a blood pressure cuff from his mother to monitor his blood pressure. He does have a somewhat stressful job.   He endorses a dull pain to his  lower abdomen that will radiate to the right side. Having to urinate exacerbates his discomfort. Adding his testicles will be more sensitive during that time. It also can be worse when working from having to wear his vest and belt. The patient is wondering if it is related to his bladder. He endorses occasional constipation. He thinks he is drinking enough water, but also adds he will drink diet coke throughout the day.  He does go to the gym everyday, adding he has not been training as hard as he used to be.  Denies significant shortness of breath or chest discomfort  with exertion  Today's Blood pressure 142/84 Total Chol 167/ LDL 95 CR 1.24 Glucose 104  EKG personally reviewed by myself on todays visit Shows normal sinus rhythm. 69 bpm.   OTHER PAST MEDICAL HISTORY REVIEWED BY ME FOR TODAY'S VISIT: Normal CT coronary calcium score 02/09/2018  PMH:   has no past medical history on file.  PSH:    Past Surgical History:  Procedure Laterality Date  . NASAL SINUS SURGERY  2008    Current Outpatient Medications  Medication Sig Dispense Refill  . fluticasone (FLONASE) 50 MCG/ACT nasal spray Place 2 sprays into both nostrils daily. 15.8 g 11  . omeprazole (PRILOSEC) 20 MG capsule Take 20 mg by mouth as needed.     No current facility-administered medications for this visit.      ALLERGIES:   Erythromycin   SOCIAL HISTORY:  The patient  reports that he has never smoked. He has never used smokeless tobacco. He reports current alcohol use of about 10.0 standard drinks of alcohol per week. He reports that he does not use drugs.   FAMILY HISTORY:   family history includes Arthritis in his paternal grandfather; Cancer in his paternal grandfather; Cancer (age of onset: 32) in his paternal uncle; Hearing loss in his paternal uncle; Heart Problems in his maternal grandfather and maternal grandmother; Heart attack (age of onset: 87) in his brother; Heart disease in his paternal uncle.    REVIEW OF SYSTEMS: Review of Systems  Constitutional: Negative.   Eyes: Negative.   Respiratory: Negative.  Negative for shortness of breath.   Cardiovascular: Negative.  Negative for chest pain.  Gastrointestinal: Positive for abdominal pain and constipation.  Genitourinary: Negative.   Musculoskeletal: Negative.   Neurological: Negative.   Psychiatric/Behavioral: Negative.   All other systems reviewed and are negative.    PHYSICAL EXAM: VS:  BP (!) 142/84 (BP Location: Right Arm, Patient Position: Sitting, Cuff Size: Normal)   Pulse 69   Ht 6\' 1"  (1.854 m)    Wt 200 lb 4 oz (90.8 kg)   BMI 26.42 kg/m  , BMI Body mass index is 26.42 kg/m.  GEN: Well nourished, well developed, in no acute distress HEENT: normal Neck: no JVD, carotid bruits, or masses Cardiac: RRR; no murmurs, rubs, or gallops,no edema  Respiratory:  clear to auscultation bilaterally, normal work of breathing GI: soft, nontender, nondistended, + BS MS: no deformity or atrophy Skin: warm and dry, no rash Neuro:  Strength and sensation are intact Psych: euthymic mood, full affect   RECENT LABS: 11/05/2017: ALT 40 01/18/2018: BUN 25; Creatinine, Ser 1.24; Hemoglobin 17.2; Magnesium 2.2; Platelets 153.0; Potassium 4.1; Sodium 139    LIPID PANEL: Lab Results  Component Value Date   CHOL 167 01/18/2018   HDL 51.10 01/18/2018   LDLCALC 95 01/18/2018   TRIG 106.0 01/18/2018      WEIGHT: Wt Readings from Last 3 Encounters:  03/29/18 200 lb 4 oz (90.8 kg)  01/18/18 226 lb 1.9 oz (102.6 kg)  12/27/17 200 lb (90.7 kg)       ASSESSMENT AND PLAN:  Family history of cardiomyopathy EKG normal, clinical exam normal, normal activities and exercise tolerance No symptoms concerning for heart failure or cardiomyopathy CT coronary calcium score of 0 We did review his CT images with him for chest and abdomen, no aortic atherosclerosis or coronary calcification noted We did offer echocardiogram, he has declined at this time given numerous medical bills Recommended if he would like to have echocardiogram or if he has new symptoms concerning for cardiomyopathy that he call our office -In hindsight discussing his brother, brother may have had viral prodrome possibly leading to viral cardiomyopathy though again details unclear  Blood Pressure Plan: EKG 12-Lead Monitor BP at home.  Likely whitecoat syndrome, He will call us if numbers run high  Lower Abdominal Pain, unspecified  Plan: Suspect possible constipation as seen on CT scan No kidney stones or other etiology  noted Recommend Miralex and citracel mix  Disposition:   F/U as needed  Total encounter time more than 60 minutes. Greater than 50% was spent in counseling and coordination of care with the patient.  Patient was seen in consultation for Dr. Derrel Nip and will be referred back to her office for ongoing care of the issues detailed above   Orders Placed This Encounter  Procedures  . EKG 12-Lead     Margit Banda  Is acting as a scribe for Ida Rogue, M.D., Ph.D.   I have reviewed the above documentation for accuracy and completeness, and I agree with the above.   Signed, Esmond Plants, M.D., Ph.D. 03/29/2018  [place], [place]

## 2018-03-29 ENCOUNTER — Encounter: Payer: Self-pay | Admitting: Cardiovascular Disease

## 2018-03-29 ENCOUNTER — Ambulatory Visit: Payer: Managed Care, Other (non HMO) | Admitting: Cardiovascular Disease

## 2018-03-29 VITALS — BP 142/84 | HR 69 | Ht 73.0 in | Wt 200.2 lb

## 2018-03-29 DIAGNOSIS — Z8249 Family history of ischemic heart disease and other diseases of the circulatory system: Secondary | ICD-10-CM | POA: Diagnosis not present

## 2018-03-29 NOTE — Patient Instructions (Addendum)
Medication Instructions:   Miralex and citracel mix   If you need a refill on your cardiac medications before your next appointment, please call your pharmacy.    Lab work: No new labs needed   If you have labs (blood work) drawn today and your tests are completely normal, you will receive your results only by: Marland Kitchen MyChart Message (if you have MyChart) OR . A paper copy in the mail If you have any lab test that is abnormal or we need to change your treatment, we will call you to review the results.   Testing/Procedures: No new testing needed   Follow-Up: At [facility], you and your health needs are our priority.  As part of our continuing mission to provide you with exceptional heart care, we have created designated Provider Care Teams.  These Care Teams include your primary Cardiologist (physician) and Advanced Practice Providers (APPs -  Physician Assistants and Nurse Practitioners) who all work together to provide you with the care you need, when you need it.  . You will need a follow up appointment as needed .  Marland Kitchen Providers on your designated Care Team:   . Murray Hodgkins, NP . Christell Faith, PA-C . Marrianne Mood, PA-C  Any Other Special Instructions Will Be Listed Below (If Applicable).  For educational health videos Log in to : [email or web address] Or : [email or web address], password : [email or web address]

## 2018-04-14 ENCOUNTER — Ambulatory Visit: Payer: Managed Care, Other (non HMO) | Admitting: Urology

## 2018-08-02 ENCOUNTER — Other Ambulatory Visit: Payer: Self-pay | Admitting: Internal Medicine

## 2018-08-08 ENCOUNTER — Ambulatory Visit: Payer: Managed Care, Other (non HMO) | Admitting: Adult Health

## 2018-08-08 ENCOUNTER — Telehealth: Payer: Self-pay | Admitting: *Deleted

## 2018-08-08 ENCOUNTER — Other Ambulatory Visit: Payer: Self-pay

## 2018-08-08 ENCOUNTER — Encounter: Payer: Self-pay | Admitting: Adult Health

## 2018-08-08 DIAGNOSIS — Z20822 Contact with and (suspected) exposure to covid-19: Secondary | ICD-10-CM

## 2018-08-08 DIAGNOSIS — J988 Other specified respiratory disorders: Secondary | ICD-10-CM

## 2018-08-08 DIAGNOSIS — Z20828 Contact with and (suspected) exposure to other viral communicable diseases: Secondary | ICD-10-CM

## 2018-08-08 NOTE — Telephone Encounter (Signed)
-----   Message from Doreen Beam, [employer] sent at 08/08/2018 12:32 PM EDT ----- Needs second test on 08/10/18 must be 24 hours after 1st test on 08/09/18 at 9 am.  Almedia Balls [place]  Exposure to covid positive patient.

## 2018-08-08 NOTE — Telephone Encounter (Signed)
Pt scheduled for covid testing 08/09/18 @ 9:00 @ [facility]. Instructions given and order placed

## 2018-08-08 NOTE — Addendum Note (Signed)
Addended by: Torrie Mayers on: 08/08/2018 03:25 PM   Modules accepted: Orders

## 2018-08-08 NOTE — Progress Notes (Signed)
Virtual Visit via Telephone Note  I connected with Anthony [name] on 08/08/18 at  9:00 AM EDT by telephone and verified that I am speaking with the correct person using two identifiers.  Location: Patient: at home  Provider: [facility], [place], [place]    I discussed the limitations, risks, security and privacy concerns of performing an evaluation and management service by telephone and the availability of in person appointments. I also discussed with the patient that there may be a patient responsible charge related to this service. The patient expressed understanding and agreed to proceed.   History of Present Illness:  P[place] called to say that he needs a telephone visit due to Covid 19 direct exposure/   Patient was a exposed to a Covid positive patient within 2 feet  around 08/03/18  who was positive for Covid he just found out.  He started having cough/ congestion on  08/05/18( he accidentally put June 3rd). Mild sore throat. Temperature 98. No fever reducers.   Patient  denies any fever, body aches,chills, rash, chest pain, shortness of breath, nausea, vomiting, or diarrhea.  He denies any ill contacts.    Observations/Objective:afebrile. No other vital signs available.   Patient is alert and oriented and responsive to questions Engages in conversation with provider. Speaks in full sentences without any pauses without any shortness of breath or distress.     Assessment and Plan:  1. Exposure to Covid-19 Virus   2. Close Exposure to Covid-19 Virus      Follow Up Instructions:    I discussed the assessment and treatment plan with the patient. The patient was provided an opportunity to ask questions and all were answered. The patient agreed with the plan and demonstrated an understanding of the instructions.   The patient was advised to call back or seek an in-person evaluation if the symptoms worsen or if the condition fails to  improve as anticipated.  I provided 15 minutes of non-face-to-face time during this encounter.   Marcille Buffy, FNP

## 2018-08-08 NOTE — Patient Instructions (Signed)
Person Under Monitoring Name: Anthony [name]  Location: [place] [id number]   Infection Prevention Recommendations for Individuals Confirmed to have, or Being Evaluated for, 2019 Novel Coronavirus (COVID-19) Infection Who Receive Care at Home  Individuals who are confirmed to have, or are being evaluated for, COVID-19 should follow the prevention steps below until a healthcare provider or local or state health department says they can return to normal activities.  Stay home except to get medical care You should restrict activities outside your home, except for getting medical care. Do not go to work, school, or public areas, and do not use public transportation or taxis.  Call ahead before visiting your doctor Before your medical appointment, call the healthcare provider and tell them that you have, or are being evaluated for, COVID-19 infection. This will help the healthcare provider's office take steps to keep other people from getting infected. Ask your healthcare provider to call the local or state health department.  Monitor your symptoms Seek prompt medical attention if your illness is worsening (e.g., difficulty breathing). Before going to your medical appointment, call the healthcare provider and tell them that you have, or are being evaluated for, COVID-19 infection. Ask your healthcare provider to call the local or state health department.  Wear a facemask You should wear a facemask that covers your nose and mouth when you are in the same room with other people and when you visit a healthcare provider. People who live with or visit you should also wear a facemask while they are in the same room with you.  Separate yourself from other people in your home As much as possible, you should stay in a different room from other people in your home. Also, you should use a separate bathroom, if available.  Avoid sharing household items You should not  share dishes, drinking glasses, cups, eating utensils, towels, bedding, or other items with other people in your home. After using these items, you should wash them thoroughly with soap and water.  Cover your coughs and sneezes Cover your mouth and nose with a tissue when you cough or sneeze, or you can cough or sneeze into your sleeve. Throw used tissues in a lined trash can, and immediately wash your hands with soap and water for at least 20 seconds or use an alcohol-based hand rub.  Wash your [facility] your hands often and thoroughly with soap and water for at least 20 seconds. You can use an alcohol-based hand sanitizer if soap and water are not available and if your hands are not visibly dirty. Avoid touching your eyes, nose, and mouth with unwashed hands.   Prevention Steps for Caregivers and Household Members of Individuals Confirmed to have, or Being Evaluated for, COVID-19 Infection Being Cared for in the Home  If you live with, or provide care at home for, a person confirmed to have, or being evaluated for, COVID-19 infection please follow these guidelines to prevent infection:  Follow healthcare provider's instructions Make sure that you understand and can help the patient follow any healthcare provider instructions for all care.  Provide for the patient's basic needs You should help the patient with basic needs in the home and provide support for getting groceries, prescriptions, and other personal needs.  Monitor the patient's symptoms If they are getting sicker, call his or her medical provider and tell them that the patient has, or is being evaluated for, COVID-19 infection. This will help the healthcare provider's office  take steps to keep other people from getting infected. Ask the healthcare provider to call the local or state health department.  Limit the number of people who have contact with the patient  If possible, have only one caregiver for the patient.   Other household members should stay in another home or place of residence. If this is not possible, they should stay  in another room, or be separated from the patient as much as possible. Use a separate bathroom, if available.  Restrict visitors who do not have an essential need to be in the home.  Keep older adults, very young children, and other sick people away from the patient Keep older adults, very young children, and those who have compromised immune systems or chronic health conditions away from the patient. This includes people with chronic heart, lung, or kidney conditions, diabetes, and cancer.  Ensure good ventilation Make sure that shared spaces in the home have good air flow, such as from an air conditioner or an opened window, weather permitting.  Wash your hands often  Wash your hands often and thoroughly with soap and water for at least 20 seconds. You can use an alcohol based hand sanitizer if soap and water are not available and if your hands are not visibly dirty.  Avoid touching your eyes, nose, and mouth with unwashed hands.  Use disposable paper towels to dry your hands. If not available, use dedicated cloth towels and replace them when they become wet.  Wear a facemask and gloves  Wear a disposable facemask at all times in the room and gloves when you touch or have contact with the patient's blood, body fluids, and/or secretions or excretions, such as sweat, saliva, sputum, nasal mucus, vomit, urine, or feces.  Ensure the mask fits over your nose and mouth tightly, and do not touch it during use.  Throw out disposable facemasks and gloves after using them. Do not reuse.  Wash your hands immediately after removing your facemask and gloves.  If your personal clothing becomes contaminated, carefully remove clothing and launder. Wash your hands after handling contaminated clothing.  Place all used disposable facemasks, gloves, and other waste in a lined container  before disposing them with other household waste.  Remove gloves and wash your hands immediately after handling these items.  Do not share dishes, glasses, or other household items with the patient  Avoid sharing household items. You should not share dishes, drinking glasses, cups, eating utensils, towels, bedding, or other items with a patient who is confirmed to have, or being evaluated for, COVID-19 infection.  After the person uses these items, you should wash them thoroughly with soap and water.  Wash laundry thoroughly  Immediately remove and wash clothes or bedding that have blood, body fluids, and/or secretions or excretions, such as sweat, saliva, sputum, nasal mucus, vomit, urine, or feces, on them.  Wear gloves when handling laundry from the patient.  Read and follow directions on labels of laundry or clothing items and detergent. In general, wash and dry with the warmest temperatures recommended on the label.  Clean all areas the individual has used often  Clean all touchable surfaces, such as counters, tabletops, doorknobs, bathroom fixtures, toilets, phones, keyboards, tablets, and bedside tables, every day. Also, clean any surfaces that may have blood, body fluids, and/or secretions or excretions on them.  Wear gloves when cleaning surfaces the patient has come in contact with.  Use a diluted bleach solution (e.g., dilute bleach with 1 part  bleach and 10 parts water) or a household disinfectant with a label that says EPA-registered for coronaviruses. To make a bleach solution at home, add 1 tablespoon of bleach to 1 quart (4 cups) of water. For a larger supply, add  cup of bleach to 1 gallon (16 cups) of water.  Read labels of cleaning products and follow recommendations provided on product labels. Labels contain instructions for safe and effective use of the cleaning product including precautions you should take when applying the product, such as wearing gloves or eye  protection and making sure you have good ventilation during use of the product.  Remove gloves and wash hands immediately after cleaning.  Monitor yourself for signs and symptoms of illness Caregivers and household members are considered close contacts, should monitor their health, and will be asked to limit movement outside of the home to the extent possible. Follow the monitoring steps for close contacts listed on the symptom monitoring form.   ? If you have additional questions, contact your local health department or call the epidemiologist on call at [phone] (available 24/7). ? This guidance is subject to change. For the most up-to-date guidance from [facility], please refer to their website: https://www.taylor.biz/ Frequently Asked Questions COVID-19 (coronavirus disease) is an infection that is caused by a large family of viruses. Some viruses cause illness in people and others cause illness in animals like camels, cats, and bats. In some cases, the viruses that cause illness in animals can spread to humans. Where did the coronavirus come from? In December 2019, [place] told the [facility]) of several cases of lung disease (human respiratory illness). These cases were linked to an open seafood and livestock market in the city of [place]. The link to the seafood and livestock market suggests that the virus may have spread from animals to humans. However, since that first outbreak in December, the virus has also been shown to spread from person to person. What is the name of the disease and the virus? Disease name Early on, this disease was called novel coronavirus. This is because scientists determined that the disease was caused by a new (novel) respiratory virus. The World Health Organization [facility]) has now named the disease COVID-19, or coronavirus disease. Virus name The virus that causes the disease is called  severe acute respiratory syndrome coronavirus 2 (SARS-CoV-2). More information on disease and virus naming World Health Organization [facility]): www.who.int/emergencies/diseases/novel-coronavirus-2019/technical-guidance/naming-the-coronavirus-disease-(covid-2019)-and-the-virus-that-causes-it Who is at risk for complications from coronavirus disease? Some people may be at higher risk for complications from coronavirus disease. This includes older adults and people who have chronic diseases, such as heart disease, diabetes, and lung disease. If you are at higher risk for complications, take these extra precautions:  Avoid close contact with people who are sick or have a fever or cough. Stay at least 3-6 ft (1-2 m) away from them, if possible.  Wash your hands often with soap and water for at least 20 seconds.  Avoid touching your face, mouth, nose, or eyes.  Keep supplies on hand at home, such as food, medicine, and cleaning supplies.  Stay home as much as possible.  Avoid social gatherings and travel. How does coronavirus disease spread? The virus that causes coronavirus disease spreads easily from person to person (is contagious). There are also cases of community-spread disease. This means the disease has spread to:  People who have no known contact with other infected people.  People who have not traveled to areas where there are known cases.  It appears to spread from one person to another through droplets from coughing or sneezing. Can I get the virus from touching surfaces or objects? There is still a lot that we do not know about the virus that causes coronavirus disease. Scientists are basing a lot of information on what they know about similar viruses, such as:  Viruses cannot generally survive on surfaces for long. They need a human body (host) to survive.  It is more likely that the virus is spread by close contact with people who are sick (direct contact), such as through: ?  Shaking hands or hugging. ? Breathing in respiratory droplets that travel through the air. This can happen when an infected person coughs or sneezes on or near other people.  It is less likely that the virus is spread when a person touches a surface or object that has the virus on it (indirect contact). The virus may be able to enter the body if the person touches a surface or object and then touches his or her face, eyes, nose, or mouth. Can a person spread the virus without having symptoms of the disease? It may be possible for the virus to spread before a person has symptoms of the disease, but this is most likely not the main way the virus is spreading. It is more likely for the virus to spread by being in close contact with people who are sick and breathing in the respiratory droplets of a sick person's cough or sneeze. What are the symptoms of coronavirus disease? Symptoms vary from person to person and can range from mild to severe. Symptoms may include:  Fever.  Cough.  Tiredness, weakness, or fatigue.  Fast breathing or feeling short of breath. These symptoms can appear anywhere from 2 to 14 days after you have been exposed to the virus. If you develop symptoms, call your health care provider. People with severe symptoms may need hospital care. If I am exposed to the virus, how long does it take before symptoms start? Symptoms of coronavirus disease may appear anywhere from 2 to 14 days after a person has been exposed to the virus. If you develop symptoms, call your health care provider. Should I be tested for this virus? Your health care provider will decide whether to test you based on your symptoms, history of exposure, and your risk factors. How does a health care provider test for this virus? Health care providers will collect samples to send for testing. Samples may include:  Taking a swab of fluid from the nose.  Taking fluid from the lungs by having you cough up mucus  (sputum) into a sterile cup.  Taking a blood sample.  Taking a stool or urine sample. Is there a treatment or vaccine for this virus? Currently, there is no vaccine to prevent coronavirus disease. Also, there are no medicines like antibiotics or antivirals to treat the virus. A person who becomes sick is given supportive care, which means rest and fluids. A person may also relieve his or her symptoms by using over-the-counter medicines that treat sneezing, coughing, and runny nose. These are the same medicines that a person takes for the common cold. If you develop symptoms, call your health care provider. People with severe symptoms may need hospital care. What can I do to protect myself and my family from this virus?     You can protect yourself and your family by taking the same actions that you would take to prevent the spread of  other viruses. Take the following actions:  Wash your hands often with soap and water for at least 20 seconds. If soap and water are not available, use alcohol-based hand sanitizer.  Avoid touching your face, mouth, nose, or eyes.  Cough or sneeze into a tissue, sleeve, or elbow. Do not cough or sneeze into your hand or the air. ? If you cough or sneeze into a tissue, throw it away immediately and wash your hands.  Disinfect objects and surfaces that you frequently touch every day.  Avoid close contact with people who are sick or have a fever or cough. Stay at least 3-6 ft (1-2 m) away from them, if possible.  Stay home if you are sick, except to get medical care. Call your health care provider before you get medical care.  Make sure your vaccines are up to date. Ask your health care provider what vaccines you need. What should I do if I need to travel? Follow travel recommendations from your local health authority, the CDC, and WHO. Travel information and advice  Centers for Disease Control and Prevention (CDC):  BodyEditor.hu  World Health Organization [facility]): ThirdIncome.ca Know the risks and take action to protect your health  You are at higher risk of getting coronavirus disease if you are traveling to areas with an outbreak or if you are exposed to travelers from areas with an outbreak.  Wash your hands often and practice good hygiene to lower the risk of catching or spreading the virus. What should I do if I am sick? General instructions to stop the spread of infection  Wash your hands often with soap and water for at least 20 seconds. If soap and water are not available, use alcohol-based hand sanitizer.  Cough or sneeze into a tissue, sleeve, or elbow. Do not cough or sneeze into your hand or the air.  If you cough or sneeze into a tissue, throw it away immediately and wash your hands.  Stay home unless you must get medical care. Call your health care provider or local health authority before you get medical care.  Avoid public areas. Do not take public transportation, if possible.  If you can, wear a mask if you must go out of the house or if you are in close contact with someone who is not sick. Keep your home clean  Disinfect objects and surfaces that are frequently touched every day. This may include: ? Counters and tables. ? Doorknobs and light switches. ? Sinks and faucets. ? Electronics such as phones, remote controls, keyboards, computers, and tablets.  Wash dishes in hot, soapy water or use a dishwasher. Air-dry your dishes.  Wash laundry in hot water. Prevent infecting other household members  Let healthy household members care for children and pets, if possible. If you have to care for children or pets, wash your hands often and wear a mask.  Sleep in a different bedroom or bed, if possible.  Do not share personal items, such as razors, toothbrushes, deodorant, combs,  brushes, towels, and washcloths. Where to find more information Centers for Disease Control and Prevention (CDC)  Information and news updates: https://www.butler-gonzalez.com/ World Health Organization [facility])  Information and news updates: MissExecutive.com.ee  Coronavirus health topic: https://www.castaneda.info/  Questions and answers on COVID-19: OpportunityDebt.at  Global tracker: who.sprinklr.com American Academy of Pediatrics (AAP)  Information for families: www.healthychildren.org/English/health-issues/conditions/chest-lungs/Pages/2019-Novel-Coronavirus.aspx The coronavirus situation is changing rapidly. Check your local health authority website or the CDC and [facility] websites for updates and news. When should I  contact a health care provider?  Contact your health care provider if you have symptoms of an infection, such as fever or cough, and you: ? Have been near anyone who is known to have coronavirus disease. ? Have come into contact with a person who is suspected to have coronavirus disease. ? Have traveled outside of the country. When should I get emergency medical care?  Get help right away by calling your local emergency services (911 in the [place] if you have: ? Trouble breathing. ? Pain or pressure in your chest. ? Confusion. ? Blue-tinged lips and fingernails. ? Difficulty waking from sleep. ? Symptoms that get worse. Let the emergency medical personnel know if you think you have coronavirus disease. Summary  A new respiratory virus is spreading from person to person and causing COVID-19 (coronavirus disease).  The virus that causes COVID-19 appears to spread easily. It spreads from one person to another through droplets from coughing or sneezing.  Older adults and those with chronic diseases are at higher risk of disease. If you are at higher risk for complications, take extra  precautions.  There is currently no vaccine to prevent coronavirus disease. There are no medicines, such as antibiotics or antivirals, to treat the virus.  You can protect yourself and your family by washing your hands often, avoiding touching your face, and covering your coughs and sneezes. This information is not intended to replace advice given to you by your health care provider. Make sure you discuss any questions you have with your health care provider. Document Released: 05/17/2018 Document Revised: 05/17/2018 Document Reviewed: 05/17/2018 Elsevier Patient Education  [place].

## 2018-08-08 NOTE — Telephone Encounter (Signed)
Scheduled for 08/10/18 @ 11:00 am @ [facility]. Needs a second test 24 hours later from first. Essential officer and required for work

## 2018-08-08 NOTE — Telephone Encounter (Signed)
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

## 2018-08-09 ENCOUNTER — Other Ambulatory Visit: Payer: Self-pay

## 2018-08-09 DIAGNOSIS — Z20822 Contact with and (suspected) exposure to covid-19: Secondary | ICD-10-CM

## 2018-08-10 ENCOUNTER — Other Ambulatory Visit: Payer: Self-pay

## 2018-08-10 ENCOUNTER — Encounter: Payer: Self-pay | Admitting: Adult Health

## 2018-08-10 DIAGNOSIS — N50819 Testicular pain, unspecified: Secondary | ICD-10-CM

## 2018-08-14 LAB — NOVEL CORONAVIRUS, NAA: SARS-CoV-2, NAA: DETECTED — AB

## 2018-08-15 ENCOUNTER — Telehealth: Payer: Self-pay | Admitting: Adult Health

## 2018-08-15 NOTE — Telephone Encounter (Signed)
Patients test results returned to him positive.  He reports that he has no symptoms of fever and has not been taking any fever reducers for the past 4 days, he never had a fever with this he reports.  He has mild nasal congestion which is improving from onset.  Sore throat resolved. He was originally tested on 08/08/2018 his test resulted positive over the weekend Saturday 712 at 2135.  Health department was called and spoke to [facility] on communicable disease line. He denies any worsening symptoms.   Onset of his symptoms was 7/3 he was exposed on 08/03/18.  He is advised that the health department will be calling him with return to work instructions and date and that he can reach back out of this clinic should he need any help or have any questions or if any symptoms change or worsen.   He is advised of red flags.

## 2018-08-16 LAB — NOVEL CORONAVIRUS, NAA: SARS-CoV-2, NAA: DETECTED — AB

## 2018-08-22 ENCOUNTER — Telehealth: Payer: Self-pay | Admitting: Adult Health

## 2018-08-22 NOTE — Telephone Encounter (Addendum)
He is off quarantine and his wife and daughter   are both positive Covid 19 test that returned 08/21/18. He was positive for Covid on  7/12/20and has completed his quarantine.  Discussed with Clance Boll and health department guidelines say the below. This has been less than twelve weeks from his original exposure, and he is asymptomatic/ afebrile. He is advised to call the clinic should he develop any symptoms or inform health department. He is advised to wear a mask and check daily temperatures and stay home if he becomes ill.   Discussed mask wear and hygiene upon returning to work and to wear mask unless eating or drinking.  Here is the [place] Communicable Disease response.  Do recovered COVID-19 cases who are re-exposed to the virus need to quarantine?  To date there is no good data to provide definitive recommendations on this issue but based on preliminary data, we recommend the below (this guidance is subject to change as additional data becomes available):   Marland Kitchen Re-exposures within three months (<12 weeks) of case recovery, do not need to quarantine.   Marland Kitchen Re-exposures greater than three months (?12 weeks) of case recovery, should be quarantined for 14 days. o These individuals should be advised to monitor themselves for symptom onset or worsening (if they never returned to baseline health status)   o We do not recommend that they get retested for COVID-19, unless clinically indicated and advised to do so by their PMD. This is because individuals can continue to have detectable virus by PCR days to weeks after they have met the end of isolation criteria, but this doesn't necessarily correlate to viable virus (meaning the virus is able to infect others).

## 2018-08-31 ENCOUNTER — Encounter: Payer: Self-pay | Admitting: Adult Health

## 2018-09-07 ENCOUNTER — Encounter: Payer: Managed Care, Other (non HMO) | Admitting: Adult Health

## 2018-09-13 ENCOUNTER — Encounter: Payer: Self-pay | Admitting: Adult Health

## 2018-09-13 ENCOUNTER — Ambulatory Visit: Payer: Managed Care, Other (non HMO) | Admitting: Adult Health

## 2018-09-13 ENCOUNTER — Other Ambulatory Visit: Payer: Self-pay

## 2018-09-13 VITALS — BP 130/82 | HR 85 | Temp 98.0°F | Resp 16 | Ht 73.0 in | Wt 200.0 lb

## 2018-09-13 DIAGNOSIS — J45990 Exercise induced bronchospasm: Secondary | ICD-10-CM | POA: Diagnosis not present

## 2018-09-13 DIAGNOSIS — Z008 Encounter for other general examination: Secondary | ICD-10-CM | POA: Diagnosis not present

## 2018-09-13 DIAGNOSIS — Z0189 Encounter for other specified special examinations: Secondary | ICD-10-CM

## 2018-09-13 MED ORDER — ALBUTEROL SULFATE HFA 108 (90 BASE) MCG/ACT IN AERS: 2.0000 | INHALATION_SPRAY | Freq: Four times a day (QID) | RESPIRATORY_TRACT | 0 refills | Status: DC | PRN

## 2018-09-13 NOTE — Patient Instructions (Signed)
Albuterol inhalation aerosol What is this medicine? ALBUTEROL (al [place]) is a bronchodilator. It helps open up the airways in your lungs to make it easier to breathe. This medicine is used to treat and to prevent bronchospasm. This medicine may be used for other purposes; ask your health care provider or pharmacist if you have questions. COMMON BRAND NAME(S): Proair HFA, Proventil, Proventil HFA, Respirol, Ventolin, Ventolin HFA What should I tell my health care provider before I take this medicine? They need to know if you have any of the following conditions:  diabetes  heart disease or irregular heartbeat  high blood pressure  pheochromocytoma  seizures  thyroid disease  an unusual or allergic reaction to albuterol, levalbuterol, other medicines, foods, dyes, or preservatives  pregnant or trying to get pregnant  breast-feeding How should I use this medicine? This medicine is for inhalation through the mouth. Follow the directions on your prescription label. Take your medicine at regular intervals. Do not use more often than directed. Make sure that you are using your inhaler correctly. Ask your doctor or health care provider if you have any questions. Talk to your pediatrician regarding the use of this medicine in children. While this drug may be prescribed for children as young as 4 years for selected conditions, precautions do apply. Overdosage: If you think you have taken too much of this medicine contact a poison control center or emergency room at once. NOTE: This medicine is only for you. Do not share this medicine with others. What if I miss a dose? If you miss a dose, use it as soon as you can. If it is almost time for your next dose, use only that dose. Do not use double or extra doses. What may interact with this medicine?  anti-infectives like chloroquine and pentamidine  caffeine  cisapride  diuretics  medicines for colds  medicines for depression or  for emotional or psychotic conditions  medicines for weight loss including some herbal products  methadone  some antibiotics like clarithromycin, erythromycin, levofloxacin, and linezolid  some heart medicines  steroid hormones like dexamethasone, cortisone, hydrocortisone  theophylline  thyroid hormones This list may not describe all possible interactions. Give your health care provider a list of all the medicines, herbs, non-prescription drugs, or dietary supplements you use. Also tell them if you smoke, drink alcohol, or use illegal drugs. Some items may interact with your medicine. What should I watch for while using this medicine? Tell your doctor or health care professional if your symptoms do not improve. Do not use extra albuterol. If your asthma or bronchitis gets worse while you are using this medicine, call your doctor right away. If your mouth gets dry try chewing sugarless gum or sucking hard candy. Drink water as directed. What side effects may I notice from receiving this medicine? Side effects that you should report to your doctor or health care professional as soon as possible:  allergic reactions like skin rash, itching or hives, swelling of the face, lips, or tongue  breathing problems  chest pain  feeling faint or lightheaded, falls  high blood pressure  irregular heartbeat  fever  muscle cramps or weakness  pain, tingling, numbness in the hands or feet  vomiting Side effects that usually do not require medical attention (report to your doctor or health care professional if they continue or are bothersome):  changes in taste  cough  dry mouth  headache  nervousness or trembling  stomach upset  stuffy or  runny nose  throat irritation  trouble sleeping This list may not describe all possible side effects. Call your doctor for medical advice about side effects. You may report side effects to FDA at 1-800-FDA-1088. Where should I keep my  medicine? Keep out of the reach of children. Store Proventil HFA and ProAir HFA at room temperature between 15 and 25 degrees C (59 and 77 degrees F). Store Ventolin HFA at room temperature between 20 and 25 degrees C (68 and 77 degrees F); it may be stored between 15 and 30 degrees C (59 and 86 degrees F) on occasion. The contents are under pressure and may burst when exposed to heat or flame. Do not freeze. This medicine does not work as well if it is too cold. Throw away the inhaler when the dose counter displays "0" or after the expiration date on the package, whichever comes first. Ventolin HFA should be thrown away 12 months after removing it from the foil pouch. NOTE: This sheet is a summary. It may not cover all possible information. If you have questions about this medicine, talk to your doctor, pharmacist, or health care provider.  2020 Elsevier/Gold Standard (2018-05-05 12:46:54) Exercise-Induced Bronchoconstriction, Adult  Exercise-induced bronchospasm (EIB) happens when the airways narrow during or after vigorous activity or exercise. The airways are the passages that lead from the nose and mouth down into the lungs. When the airways narrow, this can cause coughing, wheezing, and shortness of breath. This makes it hard to breathe. Anyone can develop EIB, even people who do not have allergies or asthma. With proper treatment, most people with EIB can be active and exercise normally. What are the causes? The exact cause of this condition is not known. Symptoms are brought on (triggered) by physical activity. EIB can also be triggered by:  Breathing very cold and dry or hot and humid air.  Chemicals, such as chlorine in swimming pools, pesticides, or fertilizers.  Outdoor triggers, such as: Geneticist, molecular pollution. ? Car exhaust. ? Pollen from grass, trees, or flowers. ? Campfire smoke.  Indoor triggers, such as: ? Dust. ? Mold. ? Tobacco smoke. ? Cleaning solutions. ? Animal  dander. What increases the risk? You are more likely to develop this condition if you:  Have asthma.  Participate in sports that require constant motion, such as basketball, hockey, skiing, and swimming.  Work outdoors.  Exercise where there are higher levels of one or more EIB triggers. What are the signs or symptoms? Symptoms of this condition include:  Coughing.  Wheezing.  Shortness of breath.  Chest pain or tightness.  Sore throat.  Upset stomach. Symptoms may worsen after exercise has stopped. How is this diagnosed? EIB is diagnosed with your medical history and a physical exam. You may also have other tests, including:  Lung function studies (spirometry).  An exercise test to check for EIB symptoms.  Allergy tests. How is this treated? Treatment for EIB includes preventing symptoms when possible, and treating EIB quickly when symptoms occur. Treatment may include:  Taking medicine that your health care provider prescribes. Medicine comes in different forms, including: ? Medicines that you breathe in (inhale). These include:  Steroids. These help to control your symptoms and are usually taken every day.  Quick relief medicines. These help to quickly relieve your breathing difficulty. ? Medicines that you take by mouth (orally). These help to control allergies and asthma.  Avoiding triggers.  Stopping physical activity or exercise to rest. Follow these instructions at home:  Take over-the-counter and prescription medicines only as told by your health care provider.  Do not use products that contain nicotine or tobacco, such as cigarettes, e-cigarettes, and chewing tobacco. If you need help quitting, ask your health care provider.  Make changes in your workout as told by your health care provider. Exercise is important to your health and well-being. ? Keep quick relief medicine with you when you are exercising. ? Tell your exercise partners about your  condition. Wear a medical ID bracelet. ? If you are planning to exercise alone or in an isolated area, let someone know where you are going and when you will be back.  You may need to see a health care provider who specializes in allergies (allergist) or the lungs (pulmonologist) for more tests.  Keep all follow-up visits as told by your health care provider. This is important. How is this prevented?  Take medicines to prevent exercise-induced bronchospasm as told by your health care provider.  Warm up before starting to play sports or exercise.  Exercise indoors to avoid outdoor triggers.  Cover your nose and mouth with a scarf to warm air that is very cold.  Tell your workout partners or trainer about your condition. Tell them how to help you if you have an episode. Contact a health care provider if:  You have coughing, wheezing, or shortness of breath that continues after treatment.  Your coughing wakes you up at night.  You have less endurance than you used to. Get help right away if:  Your medicine is not helping you breathe better.  You cannot catch your breath.  You pass out. Summary  Exercise-induced bronchospasm (EIB) happens when the airways narrow during or after exercise.  When the airways narrow, this can cause coughing, wheezing, and shortness of breath. It can be difficult to breathe.  Take over-the-counter and prescription medicines only as told by your health care provider.  Make changes in your workout as told by your health care provider.  Contact a health care provider if you continue to have trouble breathing after treatment. This information is not intended to replace advice given to you by your health care provider. Make sure you discuss any questions you have with your health care provider. Document Released: 01/19/2005 Document Revised: 10/07/2017 Document Reviewed: 10/12/2017 Elsevier Patient Education  2020 [place] Maintenance,  Male Adopting a healthy lifestyle and getting preventive care are important in promoting health and wellness. Ask your health care provider about:  The right schedule for you to have regular tests and exams.  Things you can do on your own to prevent diseases and keep yourself healthy. What should I know about diet, weight, and exercise? Eat a healthy diet   Eat a diet that includes plenty of vegetables, fruits, low-fat dairy products, and lean protein.  Do not eat a lot of foods that are high in solid fats, added sugars, or sodium. Maintain a healthy weight Body mass index (BMI) is a measurement that can be used to identify possible weight problems. It estimates body fat based on height and weight. Your health care provider can help determine your BMI and help you achieve or maintain a healthy weight. Get regular exercise Get regular exercise. This is one of the most important things you can do for your health. Most adults should:  Exercise for at least 150 minutes each week. The exercise should increase your heart rate and make you sweat (moderate-intensity exercise).  Do strengthening exercises at least  twice a week. This is in addition to the moderate-intensity exercise.  Spend less time sitting. Even light physical activity can be beneficial. Watch cholesterol and blood lipids Have your blood tested for lipids and cholesterol at 40 years of age, then have this test every 5 years. You may need to have your cholesterol levels checked more often if:  Your lipid or cholesterol levels are high.  You are older than 40 years of age.  You are at high risk for heart disease. What should I know about cancer screening? Many types of cancers can be detected early and may often be prevented. Depending on your health history and family history, you may need to have cancer screening at various ages. This may include screening for:  Colorectal cancer.  Prostate cancer.  Skin  cancer.  Lung cancer. What should I know about heart disease, diabetes, and high blood pressure? Blood pressure and heart disease  High blood pressure causes heart disease and increases the risk of stroke. This is more likely to develop in people who have high blood pressure readings, are of African descent, or are overweight.  Talk with your health care provider about your target blood pressure readings.  Have your blood pressure checked: ? Every 3-5 years if you are 37-34 years of age. ? Every year if you are 19 years old or older.  If you are between the ages of 12 and 59 and are a current or former smoker, ask your health care provider if you should have a one-time screening for abdominal aortic aneurysm (AAA). Diabetes Have regular diabetes screenings. This checks your fasting blood sugar level. Have the screening done:  Once every three years after age 31 if you are at a normal weight and have a low risk for diabetes.  More often and at a younger age if you are overweight or have a high risk for diabetes. What should I know about preventing infection? Hepatitis B If you have a higher risk for hepatitis B, you should be screened for this virus. Talk with your health care provider to find out if you are at risk for hepatitis B infection. Hepatitis C Blood testing is recommended for:  Everyone born from [birth year] through 1967.  Anyone with known risk factors for hepatitis C. Sexually transmitted infections (STIs)  You should be screened each year for STIs, including gonorrhea and chlamydia, if: ? You are sexually active and are younger than 40 years of age. ? You are older than 40 years of age and your health care provider tells you that you are at risk for this type of infection. ? Your sexual activity has changed since you were last screened, and you are at increased risk for chlamydia or gonorrhea. Ask your health care provider if you are at risk.  Ask your health care provider  about whether you are at high risk for HIV. Your health care provider may recommend a prescription medicine to help prevent HIV infection. If you choose to take medicine to prevent HIV, you should first get tested for HIV. You should then be tested every 3 months for as long as you are taking the medicine. Follow these instructions at home: Lifestyle  Do not use any products that contain nicotine or tobacco, such as cigarettes, e-cigarettes, and chewing tobacco. If you need help quitting, ask your health care provider.  Do not use street drugs.  Do not share needles.  Ask your health care provider for help if you need support  or information about quitting drugs. Alcohol use  Do not drink alcohol if your health care provider tells you not to drink.  If you drink alcohol: ? Limit how much you have to 0-2 drinks a day. ? Be aware of how much alcohol is in your drink. In the [place] one drink equals one 12 oz bottle of beer (355 mL), one 5 oz glass of wine (148 mL), or one 1 oz glass of hard liquor (44 mL). General instructions  Schedule regular health, dental, and eye exams.  Stay current with your vaccines.  Tell your health care provider if: ? You often feel depressed. ? You have ever been abused or do not feel safe at home. Summary  Adopting a healthy lifestyle and getting preventive care are important in promoting health and wellness.  Follow your health care provider's instructions about healthy diet, exercising, and getting tested or screened for diseases.  Follow your health care provider's instructions on monitoring your cholesterol and blood pressure. This information is not intended to replace advice given to you by your health care provider. Make sure you discuss any questions you have with your health care provider. Document Released: 07/18/2007 Document Revised: 01/12/2018 Document Reviewed: 01/12/2018 Elsevier Patient Education  2020 [employer].

## 2018-09-13 NOTE — Progress Notes (Signed)
[facility]  Patterson Hollenbaugh DOB: 38 y.o. MRN: [id number]  Subjective:  Here for Biometric Screen/brief exam Patient is a 38 year old male in no acute distress who comes to the clinic for his brief exam and biometric screening.  He was positive for test performed on 08/08/18 for Covid 19. He reports his symptoms were mild cough/congestion, sore throat, he was afebrile the entire course of illness.     Non smoker. exercises vigorously. Tries to maintain a healthy diet. He resumed his running again around 2 weeks ago since having Covid. He has mild " burning feeling in his lungs when he takes a deep breath intermittently only when running. He runs 10 plus miles. Denies any wheezing   Denies lightheaded or dizziness. Denies any tachycardia.  Denies neck or jaw pain.   Patient  denies any fever, body aches,chills, rash, chest pain, shortness of breath, nausea, vomiting, or diarrhea.   Note below from Dr. Candis Musa. Cardiology visit 03/29/18 EKG normal, clinical exam normal, normal activities and exercise tolerance No symptoms concerning for heart failure or cardiomyopathy CT coronary calcium score of 0 We did review his CT images with him for chest and abdomen, no aortic atherosclerosis or coronary calcification noted We did offer echocardiogram, he has declined due to medical bills.  Objective: Blood pressure 130/82, pulse 85, temperature 98 F (36.7 C), temperature source Temporal, resp. rate 16, height 6\' 1"  (1.854 m), weight 200 lb (90.7 kg), SpO2 97 %. NAD, well developed , well nourished. HEENT: Within normal limits Neck: Normal, supple Heart: Regular rate and rhythm, no murmur, rubs, gallops Lungs: Clear to ausculation without any adventitious lung sounds.    Assessment: Biometric screen 1. Encounter for other general examination   2. Encounter for biometric screening   3. Exercise-induced bronchospasm    Plan:  Orders Placed This  Encounter  Procedures  . DG Chest 2 View  . Glucose, random  . Lipid Panel With LDL/HDL Ratio   Meds ordered this encounter  Medications  . albuterol (VENTOLIN HFA) 108 (90 Base) MCG/ACT inhaler    Sig: Inhale 2 puffs into the lungs every 6 (six) hours as needed for wheezing.    Dispense:  18 g    Refill:  0   Follow up with your primary care in 1-2 weeks. Keep follow up with cardiology, consider Echocardiogram and call cardiology office for this appointment given strong family history of heart disease in brother.  Fasting glucose and lipids. Discussed with patient that today's visit here is a limited biometric screening visit (not a comprehensive exam or management of any chronic problems) Discussed some health issues, including healthy eating habits and exercise. Encouraged to follow-up with PCP for annual comprehensive preventive and wellness care (and if applicable, any chronic issues). Questions invited and answered.

## 2018-09-14 LAB — LIPID PANEL WITH LDL/HDL RATIO
Cholesterol, Total: 165 mg/dL (ref 100–199)
HDL: 50 mg/dL (ref >39)
LDL Calculated: 96 mg/dL (ref 0–99)
LDl/HDL Ratio: 1.9 ratio (ref 0.0–3.6)
Triglycerides: 94 mg/dL (ref 0–149)
VLDL Cholesterol Cal: 19 mg/dL (ref 5–40)

## 2018-09-14 LAB — GLUCOSE, RANDOM: Glucose: 109 mg/dL — ABNORMAL HIGH (ref 65–99)

## 2018-11-22 NOTE — Telephone Encounter (Signed)
Error

## 2019-04-27 ENCOUNTER — Ambulatory Visit: Payer: Managed Care, Other (non HMO) | Admitting: Medical

## 2019-04-27 ENCOUNTER — Other Ambulatory Visit: Payer: Self-pay

## 2019-04-27 ENCOUNTER — Encounter: Payer: Self-pay | Admitting: Medical

## 2019-04-27 VITALS — BP 132/74 | HR 76 | Temp 97.6°F | Resp 18 | Ht 72.0 in | Wt 200.0 lb

## 2019-04-27 DIAGNOSIS — L600 Ingrowing nail: Secondary | ICD-10-CM

## 2019-04-27 DIAGNOSIS — Z021 Encounter for pre-employment examination: Secondary | ICD-10-CM

## 2019-04-27 MED ORDER — CEPHALEXIN 500 MG PO CAPS: 500.0000 mg | ORAL_CAPSULE | Freq: Three times a day (TID) | ORAL | 0 refills | Status: DC

## 2019-04-27 NOTE — Patient Instructions (Addendum)
Ingrown Toenail An ingrown toenail occurs when the corner or sides of a toenail grow into the surrounding skin. This causes discomfort and pain. The big toe is most commonly affected, but any of the toes can be affected. If an ingrown toenail is not treated, it can become infected. What are the causes? This condition may be caused by:  Wearing shoes that are too small or tight.  An injury, such as stubbing your toe or having your toe stepped on.  Improper cutting or care of your toenails.  Having nail or foot abnormalities that were present from birth (congenital abnormalities), such as having a nail that is too big for your toe. What increases the risk? The following factors may make you more likely to develop ingrown toenails:  Age. Nails tend to get thicker with age, so ingrown nails are more common among older people.  Cutting your toenails incorrectly, such as cutting them very short or cutting them unevenly. An ingrown toenail is more likely to get infected if you have:  Diabetes.  Blood flow (circulation) problems. What are the signs or symptoms? Symptoms of an ingrown toenail may include:  Pain, soreness, or tenderness.  Redness.  Swelling.  Hardening of the skin that surrounds the toenail. Signs that an ingrown toenail may be infected include:  Fluid or pus.  Symptoms that get worse instead of better. How is this diagnosed? An ingrown toenail may be diagnosed based on your medical history, your symptoms, and a physical exam. If you have fluid or blood coming from your toenail, a sample may be collected to test for the specific type of bacteria that is causing the infection. How is this treated? Treatment depends on how severe your ingrown toenail is. You may be able to care for your toenail at home.  If you have an infection, you may be prescribed antibiotic medicines.  If you have fluid or pus draining from your toenail, your health care provider may drain it.   If you have trouble walking, you may be given crutches to use.  If you have a severe or infected ingrown toenail, you may need a procedure to remove part or all of the nail. Follow these instructions at home: Foot care   Do not pick at your toenail or try to remove it yourself.  Soak your foot in warm, soapy water. Do this for 20 minutes, 3 times a day, or as often as told by your health care provider. This helps to keep your toe clean and keep your skin soft.  Wear shoes that fit well and are not too tight. Your health care provider may recommend that you wear open-toed shoes while you heal.  Trim your toenails regularly and carefully. Cut your toenails straight across to prevent injury to the skin at the corners of the toenail. Do not cut your nails in a curved shape.  Keep your feet clean and dry to help prevent infection. Medicines  Take over-the-counter and prescription medicines only as told by your health care provider.  If you were prescribed an antibiotic, take it as told by your health care provider. Do not stop taking the antibiotic even if you start to feel better. Activity  Return to your normal activities as told by your health care provider. Ask your health care provider what activities are safe for you.  Avoid activities that cause pain. General instructions  If your health care provider told you to use crutches to help you move around, use them   as instructed.  Keep all follow-up visits as told by your health care provider. This is important. Contact a health care provider if:  You have more redness, swelling, pain, or other symptoms that do not improve with treatment.  You have fluid, blood, or pus coming from your toenail. Get help right away if:  You have a red streak on your skin that starts at your foot and spreads up your leg.  You have a fever. Summary  An ingrown toenail occurs when the corner or sides of a toenail grow into the surrounding skin.  This causes discomfort and pain. The big toe is most commonly affected, but any of the toes can be affected.  If an ingrown toenail is not treated, it can become infected.  Fluid or pus draining from your toenail is a sign of infection. Your health care provider may need to drain it. You may be given antibiotics to treat the infection.  Trimming your toenails regularly and properly can help you prevent an ingrown toenail. This information is not intended to replace advice given to you by your health care provider. Make sure you discuss any questions you have with your health care provider. Document Revised: 05/13/2018 Document Reviewed: 10/07/2016 Elsevier Patient Education  [place]. Cephalexin Tablets or Capsules What is this medicine? CEPHALEXIN (sef a LEX in) is a cephalosporin antibiotic. It treats some infections caused by bacteria. It will not work for colds, the flu, or other viruses. This medicine may be used for other purposes; ask your health care provider or pharmacist if you have questions. COMMON BRAND NAME(S): Biocef, Daxbia, Keflex, Keftab What should I tell my health care provider before I take this medicine? They need to know if you have any of these conditions: kidney disease stomach or intestine problems, especially colitis an unusual or allergic reaction to cephalexin, other cephalosporins, penicillins, other antibiotics, medicines, foods, dyes or preservatives pregnant or trying to get pregnant breast-feeding How should I use this medicine? Take this drug by mouth. Take it as directed on the prescription label at the same time every day. You can take it with or without food. If it upsets your stomach, take it with food. Take all of this drug unless your health care provider tells you to stop it early. Keep taking it even if you think you are better. Talk to your health care provider about the use of this drug in children. While it may be prescribed for selected  conditions, precautions do apply. Overdosage: If you think you have taken too much of this medicine contact a poison control center or emergency room at once. NOTE: This medicine is only for you. Do not share this medicine with others. What if I miss a dose? If you miss a dose, take it as soon as you can. If it is almost time for your next dose, take only that dose. Do not take double or extra doses. What may interact with this medicine? probenecid some other antibiotics This list may not describe all possible interactions. Give your health care provider a list of all the medicines, herbs, non-prescription drugs, or dietary supplements you use. Also tell them if you smoke, drink alcohol, or use illegal drugs. Some items may interact with your medicine. What should I watch for while using this medicine? Tell your doctor or health care provider if your symptoms do not begin to improve in a few days. This medicine may cause serious skin reactions. They can happen weeks to months  after starting the medicine. Contact your health care provider right away if you notice fevers or flu-like symptoms with a rash. The rash may be red or purple and then turn into blisters or peeling of the skin. Or, you might notice a red rash with swelling of the face, lips or lymph nodes in your neck or under your arms. Do not treat diarrhea with over the counter products. Contact your doctor if you have diarrhea that lasts more than 2 days or if it is severe and watery. If you have diabetes, you may get a false-positive result for sugar in your urine. Check with your doctor or health care provider. What side effects may I notice from receiving this medicine? Side effects that you should report to your doctor or health care professional as soon as possible: allergic reactions like skin rash, itching or hives, swelling of the face, lips, or tongue breathing problems pain or trouble passing urine redness, blistering, peeling  or loosening of the skin, including inside the mouth severe or watery diarrhea unusually weak or tired yellowing of the eyes, skin Side effects that usually do not require medical attention (report to your doctor or health care professional if they continue or are bothersome): gas or heartburn genital or anal irritation headache joint or muscle pain nausea, vomiting This list may not describe all possible side effects. Call your doctor for medical advice about side effects. You may report side effects to FDA at 1-800-FDA-1088. Where should I keep my medicine? Keep out of the reach of children and pets. Store at room temperature between 20 and 25 degrees C (68 and 77 degrees F). Throw away any unused drug after the expiration date. NOTE: This sheet is a summary. It may not cover all possible information. If you have questions about this medicine, talk to your doctor, pharmacist, or health care provider.  2020 Elsevier/Gold Standard (2018-08-26 11:27:00)
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
## 2019-05-23 ENCOUNTER — Encounter: Payer: Managed Care, Other (non HMO) | Admitting: Internal Medicine

## 2019-06-08 IMAGING — CT CT HEART SCORING
2 series · 16 of 20 positions shown, 18 images · non-contrast
Comparison: Abdominal CT 07/04/2014 and 11/15/2017.

Addendum:
EXAM:
OVER-READ INTERPRETATION  CT CHEST

The following report is an over-read performed by radiologist Dr.
Alngel Fraga [REDACTED] on 02/09/2018. This over-read
does not include interpretation of cardiac or coronary anatomy or
pathology. The coronary calcium score interpretation by the
cardiologist is attached.
CLINICAL DATA: Risk stratification
Coronary Calcium Score
TECHNIQUE: The patient was scanned on a Siemens Somatom 64 slice scanner. Axial
non-contrast 3 mm slices were carried out through the heart. The
data set was analyzed on a dedicated work station and scored using
the Agatson method.

[Series 3: casc 3.0 i36f 2 bestdiast 73 % · axial · 0.34mm/px · z∈[-338,-251]mm · 8 of 39 slices shown, 10 images]
[im 5/39  vessel]
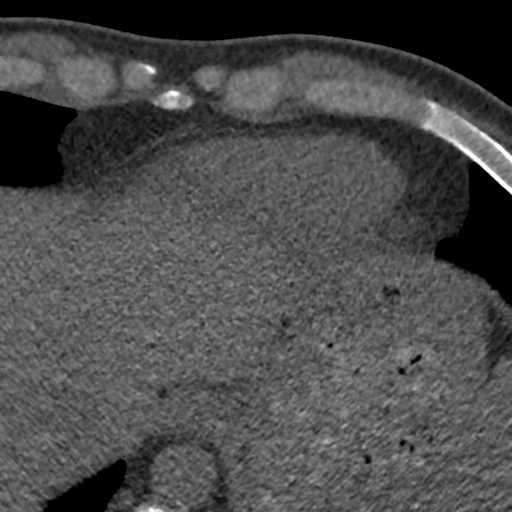
[im 5/39  lung]
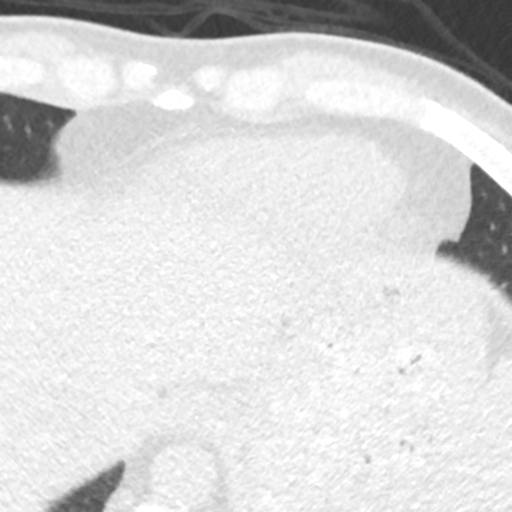
[im 9/39  vessel]
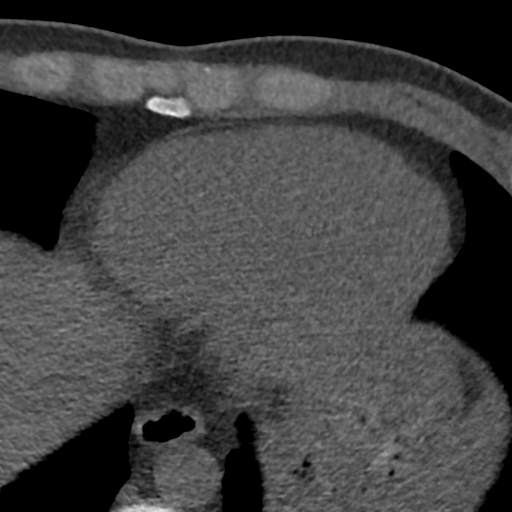
[im 13/39  vessel]
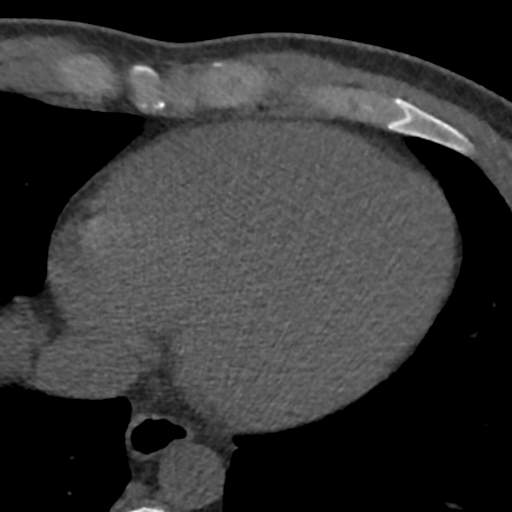
[im 17/39  vessel]
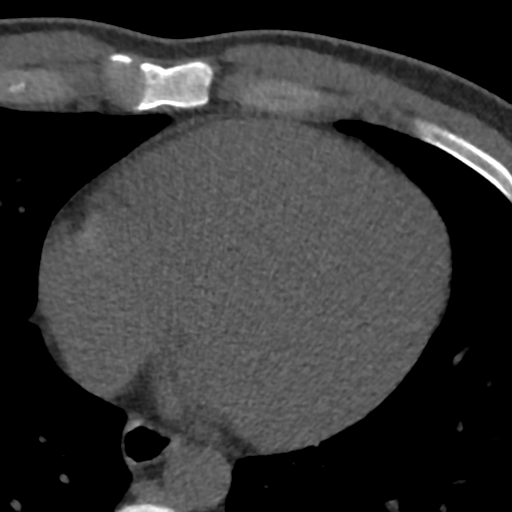
[im 22/39  vessel]
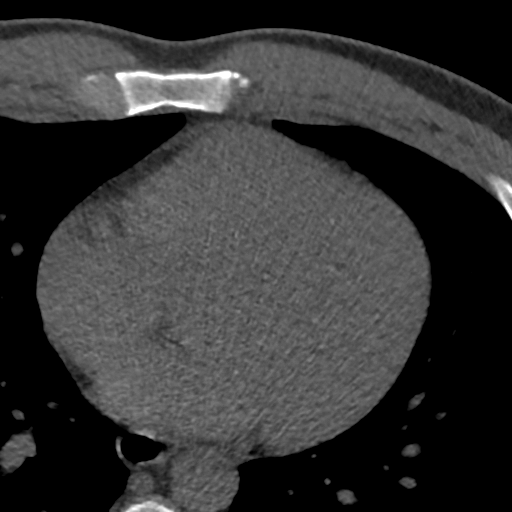
[im 22/39  lung]
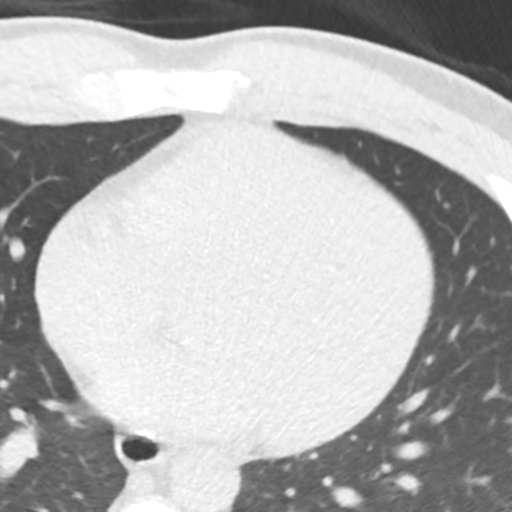
[im 26/39  vessel]
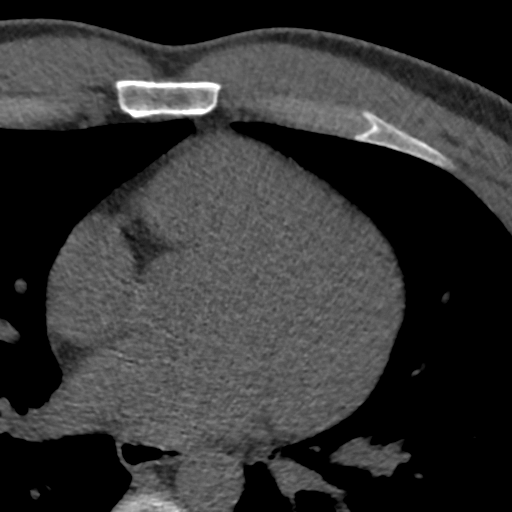
[im 30/39  vessel]
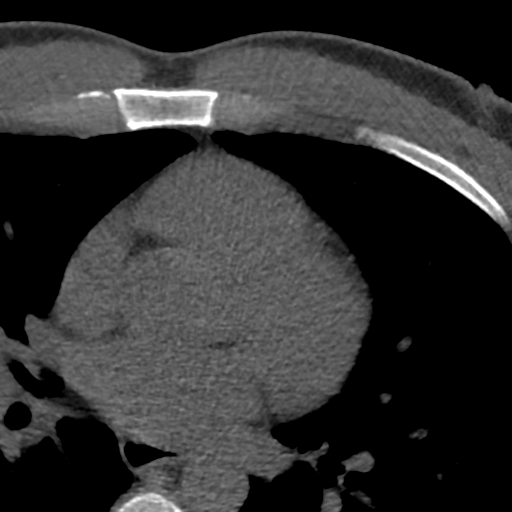
[im 34/39  vessel]
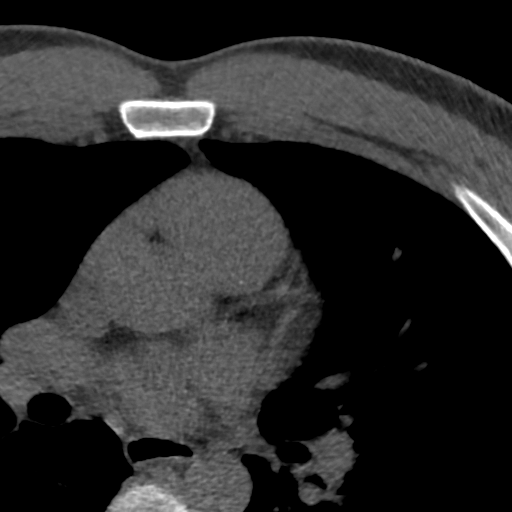

[Series 5: lung st 73 % · axial · 0.68mm/px · z∈[-340,-250]mm · 8 of 40 slices shown]
[im 5/40  lung]
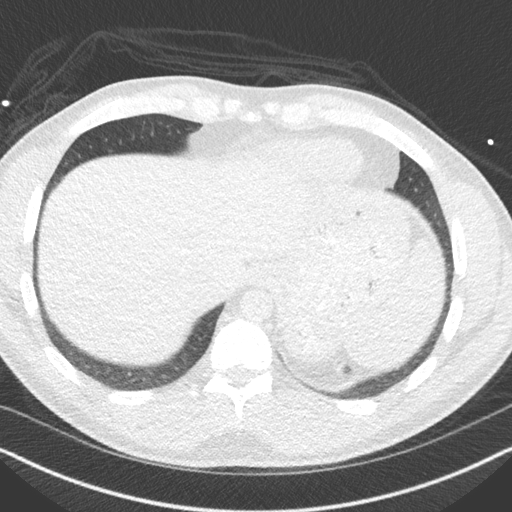
[im 9/40  lung]
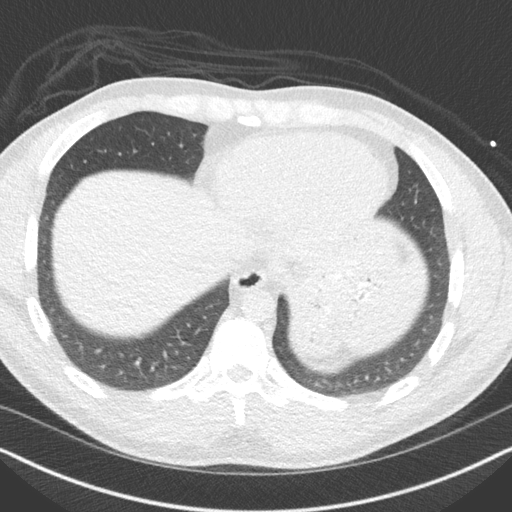
[im 14/40  lung]
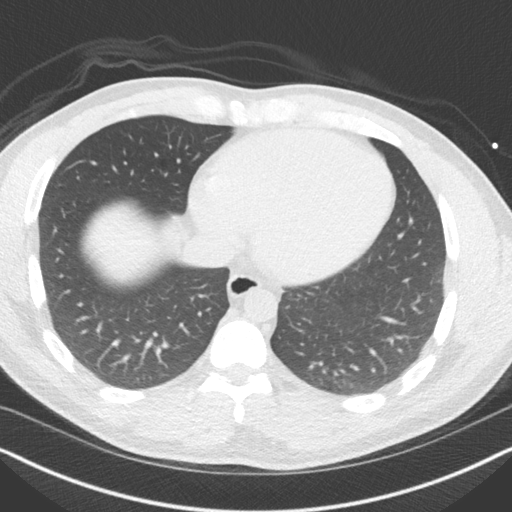
[im 18/40  lung]
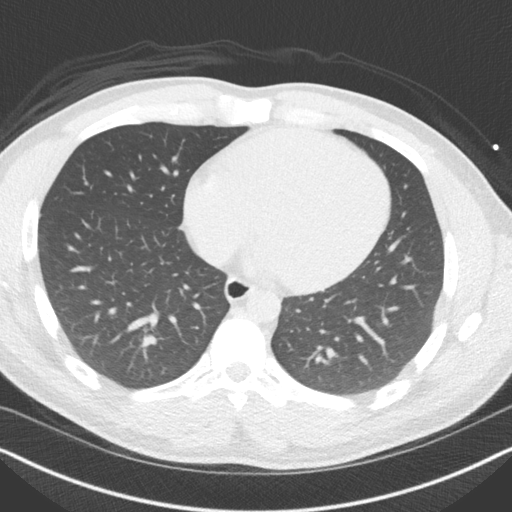
[im 22/40  lung]
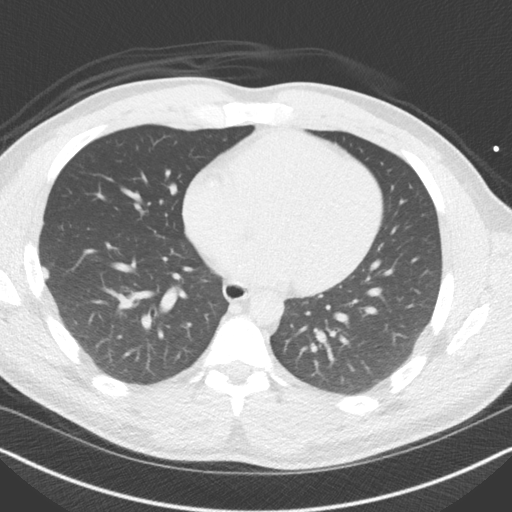
[im 27/40  lung]
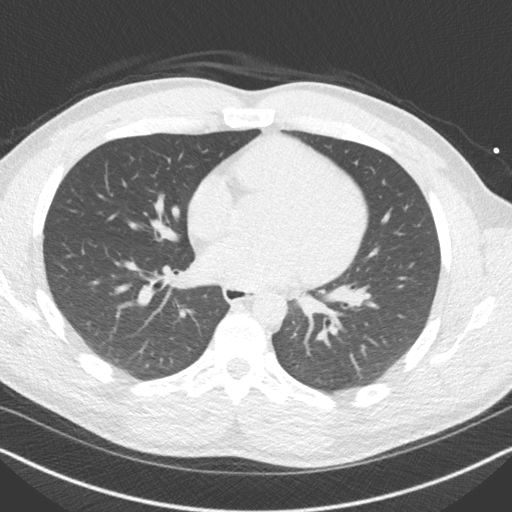
[im 31/40  lung]
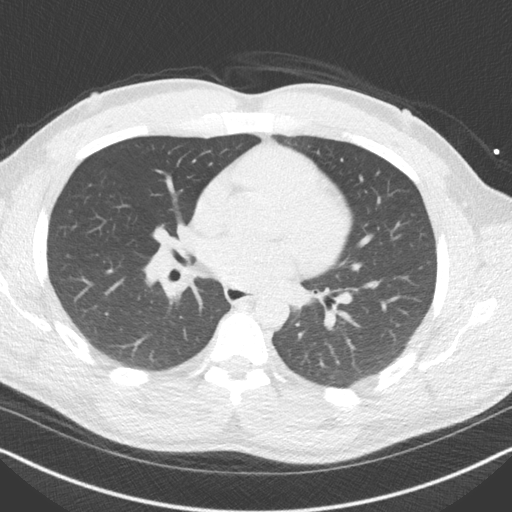
[im 35/40  lung]
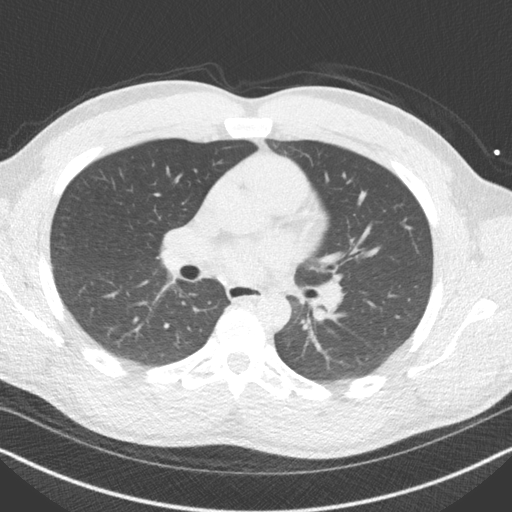

[16 of 20 positions shown; findings below may reference images not displayed]

FINDINGS: Vascular: Heart is normal size.  Visualized aorta normal caliber.

Mediastinum/Nodes: No adenopathy in the lower mediastinum or hila.

Lungs/Pleura: 8 mm subpleural nodule in the right lower lobe, stable
since prior abdominal CT. Visualized lungs otherwise clear.

Upper Abdomen: Imaging into the upper abdomen shows no acute
findings.

Musculoskeletal: Chest wall soft tissues are unremarkable. No acute
bony abnormality.
IMPRESSION: 8 mm subpleural right lower lobe pulmonary nodule, stable since
prior abdominal CT and dating back to 0848 compatible with a benign
nodule.

No acute findings.
FINDINGS: Non-cardiac: See separate report from [REDACTED].

Ascending aorta: Normal diameter 3.2 cm

Pericardium: Normal

Coronary arteries: No calcium noted
IMPRESSION: Coronary calcium score of 0.

Jyhsk I Made

*** End of Addendum ***

## 2019-06-14 ENCOUNTER — Other Ambulatory Visit: Payer: Self-pay

## 2019-06-14 ENCOUNTER — Encounter: Payer: Self-pay | Admitting: Internal Medicine

## 2019-06-14 ENCOUNTER — Ambulatory Visit (INDEPENDENT_AMBULATORY_CARE_PROVIDER_SITE_OTHER): Payer: Managed Care, Other (non HMO) | Admitting: Internal Medicine

## 2019-06-14 VITALS — BP 130/82 | HR 72 | Temp 97.3°F | Ht 72.99 in | Wt 195.0 lb

## 2019-06-14 DIAGNOSIS — M75101 Unspecified rotator cuff tear or rupture of right shoulder, not specified as traumatic: Secondary | ICD-10-CM

## 2019-06-14 DIAGNOSIS — R634 Abnormal weight loss: Secondary | ICD-10-CM | POA: Diagnosis not present

## 2019-06-14 DIAGNOSIS — Z0000 Encounter for general adult medical examination without abnormal findings: Secondary | ICD-10-CM

## 2019-06-14 DIAGNOSIS — Z9189 Other specified personal risk factors, not elsewhere classified: Secondary | ICD-10-CM

## 2019-06-14 DIAGNOSIS — R030 Elevated blood-pressure reading, without diagnosis of hypertension: Secondary | ICD-10-CM | POA: Diagnosis not present

## 2019-06-14 DIAGNOSIS — Z8616 Personal history of COVID-19: Secondary | ICD-10-CM

## 2019-06-14 DIAGNOSIS — R7989 Other specified abnormal findings of blood chemistry: Secondary | ICD-10-CM

## 2019-06-14 DIAGNOSIS — I10 Essential (primary) hypertension: Secondary | ICD-10-CM

## 2019-06-14 LAB — COMPREHENSIVE METABOLIC PANEL
ALT: 48 U/L (ref 0–53)
AST: 27 U/L (ref 0–37)
Albumin: 4.5 g/dL (ref 3.5–5.2)
Alkaline Phosphatase: 62 U/L (ref 39–117)
BUN: 25 mg/dL — ABNORMAL HIGH (ref 6–23)
CO2: 27 mEq/L (ref 19–32)
Calcium: 9.8 mg/dL (ref 8.4–10.5)
Chloride: 104 mEq/L (ref 96–112)
Creatinine, Ser: 0.98 mg/dL (ref 0.40–1.50)
GFR: 84.39 mL/min (ref >60.00)
Glucose, Bld: 102 mg/dL — ABNORMAL HIGH (ref 70–99)
Potassium: 4.1 mEq/L (ref 3.5–5.1)
Sodium: 137 mEq/L (ref 135–145)
Total Bilirubin: 2 mg/dL — ABNORMAL HIGH (ref 0.2–1.2)
Total Protein: 6.9 g/dL (ref 6.0–8.3)

## 2019-06-14 LAB — URINALYSIS, ROUTINE W REFLEX MICROSCOPIC
Bilirubin Urine: NEGATIVE
Hgb urine dipstick: NEGATIVE
Ketones, ur: NEGATIVE
Leukocytes,Ua: NEGATIVE
Nitrite: NEGATIVE
RBC / HPF: NONE SEEN (ref 0–?)
Specific Gravity, Urine: 1.015 (ref 1.000–1.030)
Total Protein, Urine: NEGATIVE
Urine Glucose: NEGATIVE
Urobilinogen, UA: 0.2 (ref 0.0–1.0)
pH: 7.5 (ref 5.0–8.0)

## 2019-06-14 LAB — LIPID PANEL
Cholesterol: 159 mg/dL (ref 0–200)
HDL: 48.7 mg/dL (ref >39.00)
LDL Cholesterol: 86 mg/dL (ref 0–99)
NonHDL: 110.74
Total CHOL/HDL Ratio: 3
Triglycerides: 122 mg/dL (ref 0.0–149.0)
VLDL: 24.4 mg/dL (ref 0.0–40.0)

## 2019-06-14 LAB — TSH: TSH: 1.77 u[IU]/mL (ref 0.35–4.50)

## 2019-06-14 LAB — SARS-COV-2 IGG: SARS-COV-2 IgG: 1.05

## 2019-06-14 MED ORDER — ONDANSETRON 4 MG PO TBDP: 4.0000 mg | ORAL_TABLET | Freq: Three times a day (TID) | ORAL | 0 refills | Status: DC | PRN
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
## 2019-06-14 NOTE — Patient Instructions (Signed)
Testosterone,  covid antibody test pending   The new "normal"  for  blood pressure is now 120/70,  Please check your blood pressure a few times at home and send me the readings so I can determine if you need to start a medication to lower your blood pressure .   Health Maintenance, Male Adopting a healthy lifestyle and getting preventive care are important in promoting health and wellness. Ask your health care provider about:  The right schedule for you to have regular tests and exams.  Things you can do on your own to prevent diseases and keep yourself healthy. What should I know about diet, weight, and exercise? Eat a healthy diet   Eat a diet that includes plenty of vegetables, fruits, low-fat dairy products, and lean protein.  Do not eat a lot of foods that are high in solid fats, added sugars, or sodium. Maintain a healthy weight Body mass index (BMI) is a measurement that can be used to identify possible weight problems. It estimates body fat based on height and weight. Your health care provider can help determine your BMI and help you achieve or maintain a healthy weight. Get regular exercise Get regular exercise. This is one of the most important things you can do for your health. Most adults should:  Exercise for at least 150 minutes each week. The exercise should increase your heart rate and make you sweat (moderate-intensity exercise).  Do strengthening exercises at least twice a week. This is in addition to the moderate-intensity exercise.  Spend less time sitting. Even light physical activity can be beneficial. Watch cholesterol and blood lipids Have your blood tested for lipids and cholesterol at 41 years of age, then have this test every 5 years. You may need to have your cholesterol levels checked more often if:  Your lipid or cholesterol levels are high.  You are older than 41 years of age.  You are at high risk for heart disease. What should I know about  cancer screening? Many types of cancers can be detected early and may often be prevented. Depending on your health history and family history, you may need to have cancer screening at various ages. This may include screening for:  Colorectal cancer.  Prostate cancer.  Skin cancer.  Lung cancer. What should I know about heart disease, diabetes, and high blood pressure? Blood pressure and heart disease  High blood pressure causes heart disease and increases the risk of stroke. This is more likely to develop in people who have high blood pressure readings, are of African descent, or are overweight.  Talk with your health care provider about your target blood pressure readings.  Have your blood pressure checked: ? Every 3-5 years if you are 21-13 years of age. ? Every year if you are 14 years old or older.  If you are between the ages of 22 and 37 and are a current or former smoker, ask your health care provider if you should have a one-time screening for abdominal aortic aneurysm (AAA). Diabetes Have regular diabetes screenings. This checks your fasting blood sugar level. Have the screening done:  Once every three years after age 27 if you are at a normal weight and have a low risk for diabetes.  More often and at a younger age if you are overweight or have a high risk for diabetes. What should I know about preventing infection? Hepatitis B If you have a higher risk for hepatitis B, you should be screened for  this virus. Talk with your health care provider to find out if you are at risk for hepatitis B infection. Hepatitis C Blood testing is recommended for:  Everyone born from [birth year] through 1967.  Anyone with known risk factors for hepatitis C. Sexually transmitted infections (STIs)  You should be screened each year for STIs, including gonorrhea and chlamydia, if: ? You are sexually active and are younger than 41 years of age. ? You are older than 41 years of age and your health  care provider tells you that you are at risk for this type of infection. ? Your sexual activity has changed since you were last screened, and you are at increased risk for chlamydia or gonorrhea. Ask your health care provider if you are at risk.  Ask your health care provider about whether you are at high risk for HIV. Your health care provider may recommend a prescription medicine to help prevent HIV infection. If you choose to take medicine to prevent HIV, you should first get tested for HIV. You should then be tested every 3 months for as long as you are taking the medicine. Follow these instructions at home: Lifestyle  Do not use any products that contain nicotine or tobacco, such as cigarettes, e-cigarettes, and chewing tobacco. If you need help quitting, ask your health care provider.  Do not use street drugs.  Do not share needles.  Ask your health care provider for help if you need support or information about quitting drugs. Alcohol use  Do not drink alcohol if your health care provider tells you not to drink.  If you drink alcohol: ? Limit how much you have to 0-2 drinks a day. ? Be aware of how much alcohol is in your drink. In the [place] one drink equals one 12 oz bottle of beer (355 mL), one 5 oz glass of wine (148 mL), or one 1 oz glass of hard liquor (44 mL). General instructions  Schedule regular health, dental, and eye exams.  Stay current with your vaccines.  Tell your health care provider if: ? You often feel depressed. ? You have ever been abused or do not feel safe at home. Summary  Adopting a healthy lifestyle and getting preventive care are important in promoting health and wellness.  Follow your health care provider's instructions about healthy diet, exercising, and getting tested or screened for diseases.  Follow your health care provider's instructions on monitoring your cholesterol and blood pressure. This information is not intended to replace advice  given to you by your health care provider. Make sure you discuss any questions you have with your health care provider. Document Revised: 01/12/2018 Document Reviewed: 01/12/2018 Elsevier Patient Education  2020 [employer].

## 2019-06-15 NOTE — Assessment & Plan Note (Signed)
He has had no occupational exposures since last year and has deferred testing

## 2019-06-15 NOTE — Assessment & Plan Note (Signed)

## 2019-06-15 NOTE — Assessment & Plan Note (Signed)
His pain has largely resolved with dry needling , done by Dr Oswaldo Milian

## 2019-06-20 LAB — TESTOS,TOTAL,FREE AND SHBG (FEMALE)
Free Testosterone: 60 pg/mL (ref 35.0–155.0)
Sex Hormone Binding: 55 nmol/L — ABNORMAL HIGH (ref 10–50)
Testosterone, Total, LC-MS-MS: 583 ng/dL (ref 250–1100)

## 2019-07-07 ENCOUNTER — Other Ambulatory Visit: Payer: Self-pay

## 2019-07-07 MED ORDER — ONDANSETRON 4 MG PO TBDP: 4.0000 mg | ORAL_TABLET | Freq: Three times a day (TID) | ORAL | 0 refills | Status: DC | PRN

## 2020-07-30 ENCOUNTER — Encounter: Payer: Managed Care, Other (non HMO) | Admitting: Nurse Practitioner

## 2020-08-15 ENCOUNTER — Encounter: Payer: Managed Care, Other (non HMO) | Admitting: Physician Assistant

## 2020-08-26 ENCOUNTER — Other Ambulatory Visit: Payer: Self-pay

## 2020-08-26 ENCOUNTER — Encounter: Payer: Self-pay | Admitting: Family Medicine

## 2020-08-26 ENCOUNTER — Ambulatory Visit: Payer: Self-pay

## 2020-08-26 ENCOUNTER — Ambulatory Visit (INDEPENDENT_AMBULATORY_CARE_PROVIDER_SITE_OTHER): Payer: Managed Care, Other (non HMO)

## 2020-08-26 ENCOUNTER — Ambulatory Visit: Payer: Managed Care, Other (non HMO) | Admitting: Family Medicine

## 2020-08-26 VITALS — BP 122/80 | HR 70 | Ht 72.0 in | Wt 200.0 lb

## 2020-08-26 DIAGNOSIS — S43432D Superior glenoid labrum lesion of left shoulder, subsequent encounter: Secondary | ICD-10-CM

## 2020-08-26 DIAGNOSIS — M25511 Pain in right shoulder: Secondary | ICD-10-CM

## 2020-08-26 DIAGNOSIS — M7551 Bursitis of right shoulder: Secondary | ICD-10-CM | POA: Diagnosis not present

## 2020-08-26 DIAGNOSIS — M75101 Unspecified rotator cuff tear or rupture of right shoulder, not specified as traumatic: Secondary | ICD-10-CM | POA: Diagnosis not present

## 2020-08-26 MED ORDER — MELOXICAM 15 MG PO TABS: 15.0000 mg | ORAL_TABLET | Freq: Every day | ORAL | 0 refills | Status: DC

## 2020-08-26 MED ORDER — NITROGLYCERIN 0.2 MG/HR TD PT24: MEDICATED_PATCH | TRANSDERMAL | 0 refills | Status: DC

## 2020-08-26 NOTE — Progress Notes (Signed)
Anthony [name] Phone: [phone] Subjective:   Anthony [name], am serving as a scribe for Dr. Hulan Saas.  This visit occurred during the SARS-CoV-2 public health emergency.  Safety protocols were in place, including screening questions prior to the visit, additional usage of staff PPE, and extensive cleaning of exam room while observing appropriate contact time as indicated for disinfecting solutions.   I'm seeing this patient by the request  of:  [name], MD  CC: Right shoulder pain  [id number]  Anthony [name] is a 40 y.o. male coming in with complaint of R shoulder injury. Patient states that he has been having R shoulder pain for one week. Patient was throwing ball with relatives and may have hurt it there or when doing Crossfit. Pushing motion increases his pain. Pain with IR. Denies any radiating symptoms. Feels loss of strength. Patient does have nitro patches from a previous shoulder injury years ago and he started using 1/4 patch today.        [name] past medical history on file. Past Surgical History:  Procedure Laterality Date   NASAL SINUS SURGERY  2008   Social History   Socioeconomic History   Marital status: Married    Spouse name: Not on file   Number of children: Not on file   Years of education: Not on file   Highest education level: Not on file  Occupational History   Not on file  Tobacco Use   Smoking status: Never   Smokeless tobacco: Never  Substance and Sexual Activity   Alcohol use: Yes    Alcohol/week: 10.0 standard drinks    Types: 10 Cans of beer per week   Drug use: [name]   Sexual activity: Yes  Other Topics Concern   Not on file  Social History Narrative   Not on file   Social Determinants of Health   Financial Resource Strain: Not on file  Food Insecurity: Not on file  Transportation Needs: Not on file  Physical Activity: Not on file  Stress: Not on file   Social Connections: Not on file   Allergies  Allergen Reactions   Erythromycin Rash   Family History  Problem Relation Age of Onset   Cancer Paternal Uncle 61       prostate ca   Heart disease Paternal Uncle        died of MI   Hearing loss Paternal Uncle    Cancer Paternal Grandfather        aplastic anemia?    Arthritis Paternal Grandfather        temporal arteritis   Heart attack Brother 44   Heart Problems Maternal Grandmother    Heart Problems Maternal Grandfather      Current Outpatient Medications (Cardiovascular):    nitroGLYCERIN (NITRO-DUR) 0.2 mg/hr patch, Apply 1/4 of a patch to skin once daily.  Current Outpatient Medications (Respiratory):    fluticasone (FLONASE) 50 MCG/ACT nasal spray, SPRAY 2 SPRAYS INTO EACH NOSTRIL EVERY DAY  Current Outpatient Medications (Analgesics):    meloxicam (MOBIC) 15 MG tablet, Take 1 tablet (15 mg total) by mouth daily.   Current Outpatient Medications (Other):    ondansetron (ZOFRAN ODT) 4 MG disintegrating tablet, Take 1 tablet (4 mg total) by mouth every 8 (eight) hours as needed for nausea or vomiting.   Reviewed prior external information including notes and imaging from  primary care provider As well as notes that were available  from care everywhere and other healthcare systems.  Past medical history, social, surgical and family history all reviewed in electronic medical record.  [name] pertanent information unless stated regarding to the chief complaint.   Review of Systems:  [name] headache, visual changes, nausea, vomiting, diarrhea, constipation, dizziness, abdominal pain, skin rash, fevers, chills, night sweats, weight loss, swollen lymph nodes, body aches, joint swelling, chest pain, shortness of breath, mood changes. POSITIVE muscle aches  Objective  Blood pressure 122/80, pulse 70, height 6' (1.829 m), weight 200 lb (90.7 kg), SpO2 99 %.   General: [name] apparent distress alert and oriented x3 mood and affect normal,  dressed appropriately.  HEENT: Pupils equal, extraocular movements intact  Respiratory: Patient's speak in full sentences and does not appear short of breath  Cardiovascular: [name] lower extremity edema, non tender, [name] erythema  Gait normal with good balance and coordination.  MSK: Right shoulder exam shows the patient does have a positive impingement noted.  Instability positive O'Brien's noted.  Rotator cuff strength 4-5 strength compared to contralateral side.  Near full range of motion [name] noted.  Limited muscular skeletal ultrasound was performed and interpreted by Hulan Saas, M  Limited ultrasound of patient's shoulder shows patient does have some mild degenerative changes of the rotator cuff noted in the supraspinatus as well as the subscapularis.  Patient does have mild arthritic changes of the acromioclavicular joint also noted. 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   Impression and Recommendations:     The above documentation has been reviewed and is accurate and complete Lyndal Pulley, DO

## 2020-08-26 NOTE — Assessment & Plan Note (Signed)
Patient seems to have more of a subacromial bursitis than truly the rotator cuff at this time.  Still though does have some mild degenerative changes of the rotator cuff we will continue to monitor.  No significant weakness noted at this point.  Patient is also had some symptoms consistent with a potential labral tear.  We will get patient to have home exercises, x-ray.  Meloxicam.  Nitroglycerin given.  Warned of potential side effects.  Follow-up with me again 4 to 5 weeks.  Worsening pain consider injection and formal physical therapy.

## 2020-08-26 NOTE — Patient Instructions (Addendum)
Meloxicam '15mg'$  for next 10 days then as needed  Nitroglycerin Protocol   Apply 1/4 nitroglycerin patch to affected area daily.  Change position of patch within the affected area every 24 hours.  You may experience a headache during the first 1-2 weeks of using the patch, these should subside.  If you experience headaches after beginning nitroglycerin patch treatment, you may take your preferred over the counter pain reliever.  Another side effect of the nitroglycerin patch is skin irritation or rash related to patch adhesive.  Please notify our office if you develop more severe headaches or rash, and stop the patch.  Tendon healing with nitroglycerin patch may require 12 to 24 weeks depending on the extent of injury.  Men should not use if taking Viagra, Cialis, or Levitra.   Do not use if you have migraines or rosacea.  Ice 20 min 2x a day Xray shoulder Hands in peripheral vision See me in 4-6 weeks

## 2020-09-10 ENCOUNTER — Ambulatory Visit (INDEPENDENT_AMBULATORY_CARE_PROVIDER_SITE_OTHER): Payer: Self-pay | Admitting: Family Medicine

## 2020-09-10 ENCOUNTER — Ambulatory Visit: Payer: Self-pay

## 2020-09-10 ENCOUNTER — Other Ambulatory Visit: Payer: Self-pay

## 2020-09-10 VITALS — BP 130/80 | HR 83 | Ht 72.0 in | Wt 199.0 lb

## 2020-09-10 DIAGNOSIS — M25511 Pain in right shoulder: Secondary | ICD-10-CM

## 2020-09-10 DIAGNOSIS — M75101 Unspecified rotator cuff tear or rupture of right shoulder, not specified as traumatic: Secondary | ICD-10-CM

## 2020-09-10 NOTE — Progress Notes (Signed)
[name] [place] Phone: [phone] Subjective:   Anthony [name], am serving as a scribe for Dr. Hulan Saas.  I'm seeing this patient by the request  of:  [name], MD  CC: shoulder pain follow up   [id number]  Anthony [name] is a 40 y.o. male coming in with complaint of right shoulder pain. Patient states that the pain is worse than last itme that he hopes the PRP helps. Patient having pain worse at night and first thing in the morning. Describes the pain as stabbing in nature. Patient still works out and feels like once he gets moving the pain gets better.       No past medical history on file. Past Surgical History:  Procedure Laterality Date   NASAL SINUS SURGERY  2008   Social History   Socioeconomic History   Marital status: Married    Spouse name: Not on file   Number of children: Not on file   Years of education: Not on file   Highest education level: Not on file  Occupational History   Not on file  Tobacco Use   Smoking status: Never   Smokeless tobacco: Never  Substance and Sexual Activity   Alcohol use: Yes    Alcohol/week: 10.0 standard drinks    Types: 10 Cans of beer per week   Drug use: No   Sexual activity: Yes  Other Topics Concern   Not on file  Social History Narrative   Not on file   Social Determinants of Health   Financial Resource Strain: Not on file  Food Insecurity: Not on file  Transportation Needs: Not on file  Physical Activity: Not on file  Stress: Not on file  Social Connections: Not on file   Allergies  Allergen Reactions   Erythromycin Rash   Family History  Problem Relation Age of Onset   Cancer Paternal Uncle 66       prostate ca   Heart disease Paternal Uncle        died of MI   Hearing loss Paternal Uncle    Cancer Paternal Grandfather        aplastic anemia?    Arthritis Paternal Grandfather        temporal arteritis   Heart  attack Brother 44   Heart Problems Maternal Grandmother    Heart Problems Maternal Grandfather      Current Outpatient Medications (Cardiovascular):    nitroGLYCERIN (NITRO-DUR) 0.2 mg/hr patch, Apply 1/4 of a patch to skin once daily.  Current Outpatient Medications (Respiratory):    fluticasone (FLONASE) 50 MCG/ACT nasal spray, SPRAY 2 SPRAYS INTO EACH NOSTRIL EVERY DAY  Current Outpatient Medications (Analgesics):    meloxicam (MOBIC) 15 MG tablet, Take 1 tablet (15 mg total) by mouth daily.   Current Outpatient Medications (Other):    ondansetron (ZOFRAN ODT) 4 MG disintegrating tablet, Take 1 tablet (4 mg total) by mouth every 8 (eight) hours as needed for nausea or vomiting.   Reviewed prior external information including notes and imaging from  primary care provider As well as notes that were available from care everywhere and other healthcare systems.  Past medical history, social, surgical and family history all reviewed in electronic medical record.  No pertanent information unless stated regarding to the chief complaint.   Review of Systems:  No headache, visual changes, nausea, vomiting, diarrhea, constipation, dizziness, abdominal pain, skin rash, fevers, chills, night sweats, weight loss,  swollen lymph nodes, body aches, joint swelling, chest pain, shortness of breath, mood changes. POSITIVE muscle aches  Objective  There were no vitals taken for this visit.   General: No apparent distress alert and oriented x3 mood and affect normal, dressed appropriately.  HEENT: Pupils equal, extraocular movements intact  Respiratory: Patient's speak in full sentences and does not appear short of breath  Cardiovascular: No lower extremity edema, non tender, no erythema  MSK: Patient noted does have weakness noted of the rotator cuff on the right side.  Positive impingement with Neer and Hawkins as well.  Procedure: Real-time Ultrasound Guided Injection of right supraspinatus  tendon sheath Device: GE Logiq Q7  Ultrasound guided injection is preferred based studies that show increased duration, increased effect, greater accuracy, decreased procedural pain, increased response rate with ultrasound guided versus blind injection.  Verbal informed consent obtained.  Time-out conducted.  Noted no overlying erythema, induration, or other signs of local infection.  Skin prepped in a sterile fashion.  Local anesthesia: Topical Ethyl chloride.  With sterile technique and under real time ultrasound guidance:  Joint visualized.  23g 1  inch needle inserted posterior approach. Pictures taken for needle placement. Patient did have injection of 2 cc 0.5% marcaine then injected 5 cc of precentrifuged PRP in the right shoulder at the supraspinatus area where hypoechoic changes were noted. Pain immediately improved suggesting accurate placement of the medication.  Advised to call if fevers/chills, erythema, induration, drainage, or persistent bleeding.  Impression: Technically successful ultrasound guided injection.   Impression and Recommendations:     The above documentation has been reviewed and is accurate and complete Lyndal Pulley, DO

## 2020-09-10 NOTE — Assessment & Plan Note (Signed)
Patient did respond relatively well to the injection today.  Hopefully we will see some improvement patient has been dealing with intermittent shoulder pain for quite some time.  Increase activity slowly.  Follow-up with me again 6 to 8 weeks.

## 2020-09-10 NOTE — Patient Instructions (Addendum)
Good to see you  No ice or IBU for 3 days See me again in 6 weeks

## 2020-09-22 ENCOUNTER — Other Ambulatory Visit: Payer: Self-pay | Admitting: Family Medicine

## 2020-09-24 ENCOUNTER — Ambulatory Visit: Payer: Managed Care, Other (non HMO) | Admitting: Family Medicine

## 2020-09-25 ENCOUNTER — Ambulatory Visit (INDEPENDENT_AMBULATORY_CARE_PROVIDER_SITE_OTHER): Payer: Managed Care, Other (non HMO) | Admitting: Internal Medicine

## 2020-09-25 ENCOUNTER — Encounter: Payer: Self-pay | Admitting: Internal Medicine

## 2020-09-25 ENCOUNTER — Other Ambulatory Visit: Payer: Self-pay

## 2020-09-25 VITALS — BP 118/60 | HR 65 | Temp 96.6°F | Ht 72.0 in | Wt 197.6 lb

## 2020-09-25 DIAGNOSIS — R5383 Other fatigue: Secondary | ICD-10-CM | POA: Diagnosis not present

## 2020-09-25 DIAGNOSIS — D492 Neoplasm of unspecified behavior of bone, soft tissue, and skin: Secondary | ICD-10-CM

## 2020-09-25 DIAGNOSIS — Z0000 Encounter for general adult medical examination without abnormal findings: Secondary | ICD-10-CM

## 2020-09-25 DIAGNOSIS — Z13220 Encounter for screening for lipoid disorders: Secondary | ICD-10-CM

## 2020-09-25 NOTE — Patient Instructions (Signed)
I'm referring you for a dermatology exam to make sure the place on your left jawline is not a skin cancer .     We will send the completed form to [facility]

## 2020-09-25 NOTE — Assessment & Plan Note (Signed)

## 2020-09-25 NOTE — Progress Notes (Signed)
Patient ID: Anthony [name], male    DOB: November 10, 1980  Age: 40 y.o. MRN: [id number]  The patient is here for annual preventive  examination and management of other chronic and acute problems.   The risk factors are reflected in the social history.  The roster of all physicians providing medical care to patient - is listed in the Snapshot section of the chart.  Activities of daily living:  The patient is 100% independent in all ADLs: dressing, toileting, feeding as well as independent mobility  Home safety : The patient has smoke detectors in the home. He wears seatbelts.  There are no unsecured firearms at home. There is no violence in the home.   There is no risks for hepatitis, STDs or HIV. There is no   history of blood transfusion. They have no travel history to infectious disease endemic areas of the world.  The patient has seen their dentist in the last six month. They have seen their eye doctor in the last year.  He denies any hearing difficulty with regard to whispered voices and some television programs.  They have deferred audiologic testing in the last year.  They do not  have excessive sun exposure. Discussed the need for sun protection: hats, long sleeves and use of sunscreen if there is significant sun exposure.   Diet: the importance of a healthy diet is discussed. They do have a healthy diet.  The benefits of regular aerobic exercise were discussed. He works out 6 day per week,  weight lifting and cardio  60 miinutes.   Depression screen: there are no signs or vegative symptoms of depression- irritability, change in appetite, anhedonia, sadness/tearfullness.  The following portions of the patient's history were reviewed and updated as appropriate: allergies, current medications, past family history, past medical history,  past surgical history, past social history  and problem list.  Visual acuity was not assessed per patient preference since she has regular follow up with her  ophthalmologist. Hearing and body mass index were assessed and reviewed.   During the course of the visit the patient was educated and counseled about appropriate screening and preventive services including : fall prevention , diabetes screening, nutrition counseling, colorectal cancer screening, and recommended immunizations.    CC: The primary encounter diagnosis was Fatigue, unspecified type. Diagnoses of Screening for hyperlipidemia, Skin neoplasm, and Encounter for preventive health examination were also pertinent to this visit.  1) He was diagnosed with a right supraspinatus tear approximately one month ago,  treated by Dr Tamala Julian  with PRP.   2) left knee pain intermittently hursts after workouts.  No swelling or clicking     History Zakaree has no past medical history on file.   He has a past surgical history that includes Nasal sinus surgery (2008).   His family history includes Arthritis in his paternal grandfather; Cancer in his paternal grandfather; Cancer (age of onset: 71) in his paternal uncle; Hearing loss in his paternal uncle; Heart Problems in his maternal grandfather and maternal grandmother; Heart attack (age of onset: 90) in his brother; Heart disease in his paternal uncle.He reports that he has never smoked. He has never used smokeless tobacco. He reports current alcohol use of about 10.0 standard drinks per week. He reports that he does not use drugs.  Outpatient Medications Prior to Visit  Medication Sig Dispense Refill   fluticasone (FLONASE) 50 MCG/ACT nasal spray SPRAY 2 SPRAYS INTO EACH NOSTRIL EVERY DAY 48 mL 3   nitroGLYCERIN (NITRO-DUR) 0.2  mg/hr patch Apply 1/4 of a patch to skin once daily. 30 patch 0   meloxicam (MOBIC) 15 MG tablet TAKE 1 TABLET (15 MG TOTAL) BY MOUTH DAILY. (Patient not taking: Reported on 09/25/2020) 30 tablet 0   ondansetron (ZOFRAN ODT) 4 MG disintegrating tablet Take 1 tablet (4 mg total) by mouth every 8 (eight) hours as needed for nausea  or vomiting. (Patient not taking: No sig reported) 20 tablet 0   No facility-administered medications prior to visit.    Review of Systems  Patient denies headache, fevers, malaise, unintentional weight loss, skin rash, eye pain, sinus congestion and sinus pain, sore throat, dysphagia,  hemoptysis , cough, dyspnea, wheezing, chest pain, palpitations, orthopnea, edema, abdominal pain, nausea, melena, diarrhea, constipation, flank pain, dysuria, hematuria, urinary  Frequency, nocturia, numbness, tingling, seizures,  Focal weakness, Loss of consciousness,  Tremor, insomnia, depression, anxiety, and suicidal ideation.     Objective:  BP 118/60 (BP Location: Left Arm, Patient Position: Sitting, Cuff Size: Large)   Pulse 65   Temp (!) 96.6 F (35.9 C) (Temporal)   Ht 6' (1.829 m)   Wt 197 lb 9.6 oz (89.6 kg)   SpO2 99%   BMI 26.80 kg/m   Physical Exam  General appearance: alert, cooperative and appears stated age Ears: normal TM's and external ear canals both ears Throat: lips, mucosa, and tongue normal; teeth and gums normal Neck: no adenopathy, no carotid bruit, supple, symmetrical, trachea midline and thyroid not enlarged, symmetric, no tenderness/mass/nodules Back: symmetric, no curvature. ROM normal. No CVA tenderness. Lungs: clear to auscultation bilaterally Heart: regular rate and rhythm, S1, S2 normal, no murmur, click, rub or gallop Abdomen: soft, non-tender; bowel sounds normal; no masses,  no organomegaly Pulses: 2+ and symmetric Skin: Skin color, texture, turgor normal. No rashes or lesions Lymph nodes: Cervical, supraclavicular, and axillary nodes normal.   Assessment & Plan:   Problem List Items Addressed This Visit       Unprioritized   Encounter for preventive health examination    age appropriate education and counseling updated, referrals for preventative services and immunizations addressed, dietary and smoking counseling addressed, most recent labs reviewed.  I  have personally reviewed and have noted:   1) the patient's medical and social history 2) The pt's use of alcohol, tobacco, and illicit drugs 3) The patient's current medications and supplements 4) Functional ability including ADL's, fall risk, home safety risk, hearing and visual impairment 5) Diet and physical activities 6) Evidence for depression or mood disorder 7) The patient's height, weight, and BMI have been recorded in the chart   I have made referrals, and provided counseling and education based on review of the above      Relevant Orders   Lipid panel (Completed)   Comprehensive metabolic panel (Completed)   Other Visit Diagnoses     Fatigue, unspecified type    -  Primary   Relevant Orders   TSH (Completed)   Screening for hyperlipidemia       Skin neoplasm       Relevant Orders   Ambulatory referral to Dermatology      Medications Discontinued During This Encounter  Medication Reason   meloxicam (MOBIC) 15 MG tablet    ondansetron (ZOFRAN ODT) 4 MG disintegrating tablet     Follow-up: Return in about 1 year (around 09/25/2021).   [name], MD

## 2020-09-26 LAB — LIPID PANEL
Cholesterol: 152 mg/dL (ref 0–200)
HDL: 45.2 mg/dL (ref >39.00)
LDL Cholesterol: 71 mg/dL (ref 0–99)
NonHDL: 106.34
Total CHOL/HDL Ratio: 3
Triglycerides: 176 mg/dL — ABNORMAL HIGH (ref 0.0–149.0)
VLDL: 35.2 mg/dL (ref 0.0–40.0)

## 2020-09-26 LAB — COMPREHENSIVE METABOLIC PANEL
ALT: 27 U/L (ref 0–53)
AST: 20 U/L (ref 0–37)
Albumin: 4.5 g/dL (ref 3.5–5.2)
Alkaline Phosphatase: 49 U/L (ref 39–117)
BUN: 25 mg/dL — ABNORMAL HIGH (ref 6–23)
CO2: 29 mEq/L (ref 19–32)
Calcium: 9.7 mg/dL (ref 8.4–10.5)
Chloride: 101 mEq/L (ref 96–112)
Creatinine, Ser: 1.18 mg/dL (ref 0.40–1.50)
GFR: 76.36 mL/min (ref >60.00)
Glucose, Bld: 84 mg/dL (ref 70–99)
Potassium: 3.9 mEq/L (ref 3.5–5.1)
Sodium: 139 mEq/L (ref 135–145)
Total Bilirubin: 1.4 mg/dL — ABNORMAL HIGH (ref 0.2–1.2)
Total Protein: 6.8 g/dL (ref 6.0–8.3)

## 2020-09-26 LAB — TSH: TSH: 1.98 u[IU]/mL (ref 0.35–5.50)

## 2020-09-28 DIAGNOSIS — D492 Neoplasm of unspecified behavior of bone, soft tissue, and skin: Secondary | ICD-10-CM | POA: Insufficient documentation

## 2020-10-22 NOTE — Progress Notes (Signed)
[place] Phone: [phone] Subjective:   Anthony [name], am serving as a scribe for Dr. Hulan [name]. This visit occurred during the SARS-CoV-2 public health emergency.  Safety protocols were in place, including screening questions prior to the visit, additional usage of staff PPE, and extensive cleaning of exam room while observing appropriate contact time as indicated for disinfecting solutions.   I'm seeing this patient by the request  of:  Anthony Mc, MD  CC: Right shoulder pain follow-up  [id number]  09/10/2020 Patient did respond relatively well to the injection today.  Hopefully we will see some improvement patient has been dealing with intermittent shoulder pain for quite some time.  Increase activity slowly.  Follow-up with me again 6 to 8 weeks.  Update 10/23/2020 Anthony [name] is a 40 y.o. male coming in with complaint of R shoulder pain. Patient states that he is better but still has pain with snatching and OH squatting. PRP performed last visit.        [name] past medical history on file. Past Surgical History:  Procedure Laterality Date   NASAL SINUS SURGERY  2008   Social History   Socioeconomic History   Marital status: Married    Spouse name: Not on file   Number of children: Not on file   Years of education: Not on file   Highest education level: Not on file  Occupational History   Not on file  Tobacco Use   Smoking status: Never   Smokeless tobacco: Never  Substance and Sexual Activity   Alcohol use: Yes    Alcohol/week: 10.0 standard drinks    Types: 10 Cans of beer per week   Drug use: [name]   Sexual activity: Yes  Other Topics Concern   Not on file  Social History Narrative   Not on file   Social Determinants of Health   Financial Resource Strain: Not on file  Food Insecurity: Not on file  Transportation Needs: Not on file  Physical Activity: Not on file  Stress:  Not on file  Social Connections: Not on file   Allergies  Allergen Reactions   Erythromycin Rash   Family History  Problem Relation Age of Onset   Cancer Paternal Uncle 69       prostate ca   Heart disease Paternal Uncle        died of MI   Hearing loss Paternal Uncle    Cancer Paternal Grandfather        aplastic anemia?    Arthritis Paternal Grandfather        temporal arteritis   Heart attack Brother 44   Heart Problems Maternal Grandmother    Heart Problems Maternal Grandfather      Current Outpatient Medications (Cardiovascular):    nitroGLYCERIN (NITRO-DUR) 0.2 mg/hr patch, Apply 1/4 of a patch to skin once daily.  Current Outpatient Medications (Respiratory):    fluticasone (FLONASE) 50 MCG/ACT nasal spray, SPRAY 2 SPRAYS INTO EACH NOSTRIL EVERY DAY     Reviewed prior external information including notes and imaging from  primary care provider As well as notes that were available from care everywhere and other healthcare systems.  Past medical history, social, surgical and family history all reviewed in electronic medical record.  [name] pertanent information unless stated regarding to the chief complaint.   Review of Systems:  [name] headache, visual changes, nausea, vomiting, diarrhea, constipation, dizziness, abdominal pain, skin rash, fevers, chills,  night sweats, weight loss, swollen lymph nodes, body aches, joint swelling, chest pain, shortness of breath, mood changes. POSITIVE muscle aches  Objective  Blood pressure 126/72, pulse 74, height 6' (1.829 m), weight 196 lb (88.9 kg), SpO2 99 %.   General: [name] apparent distress alert and oriented x3 mood and affect normal, dressed appropriately.  HEENT: Pupils equal, extraocular movements intact  Respiratory: Patient's speak in full sentences and does not appear short of breath  Cardiovascular: [name] lower extremity edema, non tender, [name] erythema  Gait normal with good balance and coordination.  MSK: Right shoulder exam  shows the patient still has some mild positive impingement noted.  Rotator cuff strength is 5 out of 5.  Limited muscular skeletal ultrasound was performed and interpreted by Anthony [name], M  Limited ultrasound shows the patient has good scar tissue formation noted of the rotator cuff for potential tear was seen previously noted.  Patient has a very mild hypoechoic changes noted that is consistent with a reactive bursitis in the subacromial area.  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 Impression: Interval improvement of the rotator cuff and continued AC arthritis    Impression and Recommendations:     The above documentation has been reviewed and is accurate and complete Anthony Pulley, DO

## 2020-10-23 ENCOUNTER — Ambulatory Visit: Payer: Self-pay

## 2020-10-23 ENCOUNTER — Encounter: Payer: Self-pay | Admitting: Family Medicine

## 2020-10-23 ENCOUNTER — Ambulatory Visit: Payer: Managed Care, Other (non HMO) | Admitting: Family Medicine

## 2020-10-23 ENCOUNTER — Other Ambulatory Visit: Payer: Self-pay

## 2020-10-23 VITALS — BP 126/72 | HR 74 | Ht 72.0 in | Wt 196.0 lb

## 2020-10-23 DIAGNOSIS — M75101 Unspecified rotator cuff tear or rupture of right shoulder, not specified as traumatic: Secondary | ICD-10-CM | POA: Diagnosis not present

## 2020-10-23 DIAGNOSIS — M19011 Primary osteoarthritis, right shoulder: Secondary | ICD-10-CM | POA: Diagnosis not present

## 2020-10-23 DIAGNOSIS — M25511 Pain in right shoulder: Secondary | ICD-10-CM

## 2020-10-23 NOTE — Assessment & Plan Note (Signed)
Progress noted on ultrasound today.  Patient does have improvement in range of motion as well as strength.  Discussed with patient that it is still another 6 weeks of increasing activity slowly will be the most beneficial.  Discussed icing regimen and home exercises.  Increase activity slowly and follow-up with me again in 6 weeks.

## 2020-10-23 NOTE — Patient Instructions (Signed)
RTC is healing Let's give it 5-6 weeks If still in pain will do AC injection See me in 5-6 weeks

## 2020-10-23 NOTE — Assessment & Plan Note (Signed)
Patient also has the acromioclavicular arthritis.  We discussed with patient that we could consider the potential injection in this area as well which I think will be beneficial.  Patient wants to hold at the moment but will consider it at follow-up.

## 2020-11-21 ENCOUNTER — Other Ambulatory Visit: Payer: Self-pay | Admitting: Family Medicine

## 2020-12-06 ENCOUNTER — Ambulatory Visit: Payer: Managed Care, Other (non HMO) | Admitting: Family Medicine

## 2020-12-12 NOTE — Progress Notes (Signed)
Anthony [name] [address] [place] Phone: [phone] Subjective:   IVilma Meckel, am serving as a scribe for Dr. Hulan Saas. This visit occurred during the SARS-CoV-2 public health emergency.  Safety protocols were in place, including screening questions prior to the visit, additional usage of staff PPE, and extensive cleaning of exam room while observing appropriate contact time as indicated for disinfecting solutions.   I'm seeing this patient by the request  of:  Anthony Mc, MD  CC: Right shoulder pain follow-up  [id number]  10/23/2020 Patient also has the acromioclavicular arthritis.  We discussed with patient that we could consider the potential injection in this area as well which I think will be beneficial.  Patient wants to hold at the moment but will consider it at follow-up.  Progress noted on ultrasound today.  Patient does have improvement in range of motion as well as strength.  Discussed with patient that it is still another 6 weeks of increasing activity slowly will be the most beneficial.  Discussed icing regimen and home exercises.  Increase activity slowly and follow-up with me again in 6 weeks.  Update 12/17/2020 Anthony [name] is a 40 y.o. male coming in with complaint of R RTC tear. Patient states gotten a little better. Still feel a little pinch. About 2-3 weeks ago feels like her overworked or injured further. Has been icing and dry needling does help. Patient states that overall has made significant improvement.  Very minimal discomfort on a regular basis.  Patient has been able to increase activity.  Been working out.  Still overhead activity causes severe pain.     No past medical history on file. Past Surgical History:  Procedure Laterality Date   NASAL SINUS SURGERY  2008   Social History   Socioeconomic History   Marital status: Married    Spouse name: Not on file   Number of children: Not on  file   Years of education: Not on file   Highest education level: Not on file  Occupational History   Not on file  Tobacco Use   Smoking status: Never   Smokeless tobacco: Never  Substance and Sexual Activity   Alcohol use: Yes    Alcohol/week: 10.0 standard drinks    Types: 10 Cans of beer per week   Drug use: No   Sexual activity: Yes  Other Topics Concern   Not on file  Social History Narrative   Not on file   Social Determinants of Health   Financial Resource Strain: Not on file  Food Insecurity: Not on file  Transportation Needs: Not on file  Physical Activity: Not on file  Stress: Not on file  Social Connections: Not on file   Allergies  Allergen Reactions   Erythromycin Rash   Family History  Problem Relation Age of Onset   Cancer Paternal Uncle 67       prostate ca   Heart disease Paternal Uncle        died of MI   Hearing loss Paternal Uncle    Cancer Paternal Grandfather        aplastic anemia?    Arthritis Paternal Grandfather        temporal arteritis   Heart attack Brother 3   Heart Problems Maternal Grandmother    Heart Problems Maternal Grandfather      Current Outpatient Medications (Cardiovascular):    nitroGLYCERIN (NITRODUR - DOSED IN MG/24 HR) 0.2 mg/hr patch, APPLY  1/4 OF A PATCH TO SKIN ONCE DAILY.  Current Outpatient Medications (Respiratory):    fluticasone (FLONASE) 50 MCG/ACT nasal spray, SPRAY 2 SPRAYS INTO EACH NOSTRIL EVERY DAY      Objective  Blood pressure 128/88, pulse 82, height 6' (1.829 m), weight 200 lb (90.7 kg), SpO2 97 %.   General: No apparent distress alert and oriented x3 mood and affect normal, dressed appropriately.  HEENT: Pupils equal, extraocular movements intact  Respiratory: Patient's speak in full sentences and does not appear short of breath  Cardiovascular: No lower extremity edema, non tender, no erythema  Gait normal with good balance and coordination.  MSK: Right shoulder exam shows the patient  still has mild positive crossover and mild positive O'Brien's.  Rotator cuff strength is intact.  He does have very mild impingement signs with Hawkins.  Limited muscular skeletal ultrasound was performed and interpreted by Hulan Saas, M  Limited ultrasound of the shoulder shows the patient does still have some mild partial possible tearing noted with increasing neovascularization of the supraspinatus near the insertion.  Very mild reactive bursitis noted.  Mild hypoechoic changes of the acromioclavicular joint.  Still interval improvement from previous exam. Impression: Interval improvement    Impression and Recommendations:     The above documentation has been reviewed and is accurate and complete Lyndal Pulley, DO

## 2020-12-17 ENCOUNTER — Ambulatory Visit: Payer: Managed Care, Other (non HMO) | Admitting: Family Medicine

## 2020-12-17 ENCOUNTER — Encounter: Payer: Self-pay | Admitting: Family Medicine

## 2020-12-17 ENCOUNTER — Ambulatory Visit: Payer: Self-pay

## 2020-12-17 ENCOUNTER — Other Ambulatory Visit: Payer: Self-pay

## 2020-12-17 VITALS — BP 128/88 | HR 82 | Ht 72.0 in | Wt 200.0 lb

## 2020-12-17 DIAGNOSIS — M75101 Unspecified rotator cuff tear or rupture of right shoulder, not specified as traumatic: Secondary | ICD-10-CM | POA: Diagnosis not present

## 2020-12-17 NOTE — Assessment & Plan Note (Signed)
Patient is now 3 months after PRP and does look like patient has done very well.  There are some very small amount of tearing noted.  Do not feel that patient is completely gone at the moment and would like patient to continue with the nitroglycerin.  Patient is having very minimal pain minimal overall activities working out on a regular basis not taking any pain medication.  Patient would like to have another follow-up in 2 to 3 months or hopefully we will further evaluate the rotator cuff tear.  Patient can see me as needed.

## 2020-12-17 NOTE — Patient Instructions (Addendum)
Good to see you! Keep hands in peripheral No snatches Ok to increase nitro to 1/2 patch but watch for side effect and do not wear at night See you again in 6-8 weeks

## 2021-03-12 ENCOUNTER — Ambulatory Visit (INDEPENDENT_AMBULATORY_CARE_PROVIDER_SITE_OTHER): Payer: Managed Care, Other (non HMO) | Admitting: Adult Health

## 2021-03-12 ENCOUNTER — Telehealth: Payer: Self-pay | Admitting: Internal Medicine

## 2021-03-12 DIAGNOSIS — Z91199 Patient's noncompliance with other medical treatment and regimen due to unspecified reason: Secondary | ICD-10-CM | POA: Insufficient documentation

## 2021-03-12 NOTE — Addendum Note (Signed)
Addended by: Doreen Beam on: 03/12/2021 01:58 PM   Modules accepted: Level of Service

## 2021-03-12 NOTE — Progress Notes (Signed)
No show for visit on 03/12/2021 was triaged and scheduled.

## 2021-03-12 NOTE — Telephone Encounter (Signed)
Pt has been scheduled with Sharyn Lull, NP today.

## 2021-03-12 NOTE — Telephone Encounter (Signed)
Pt called in stating that he has had left pain and swelling since Sunday. Sent to access nurse

## 2021-12-23 IMAGING — DX DG SHOULDER 2+V*R*
3 series · 3 of 3 positions shown · non-contrast
Comparison: 06/04/2015

CLINICAL DATA: Acute pain of right shoulder.

EXAM:
RIGHT SHOULDER - 2+ VIEW

[shoulder ap (1 of 2)]
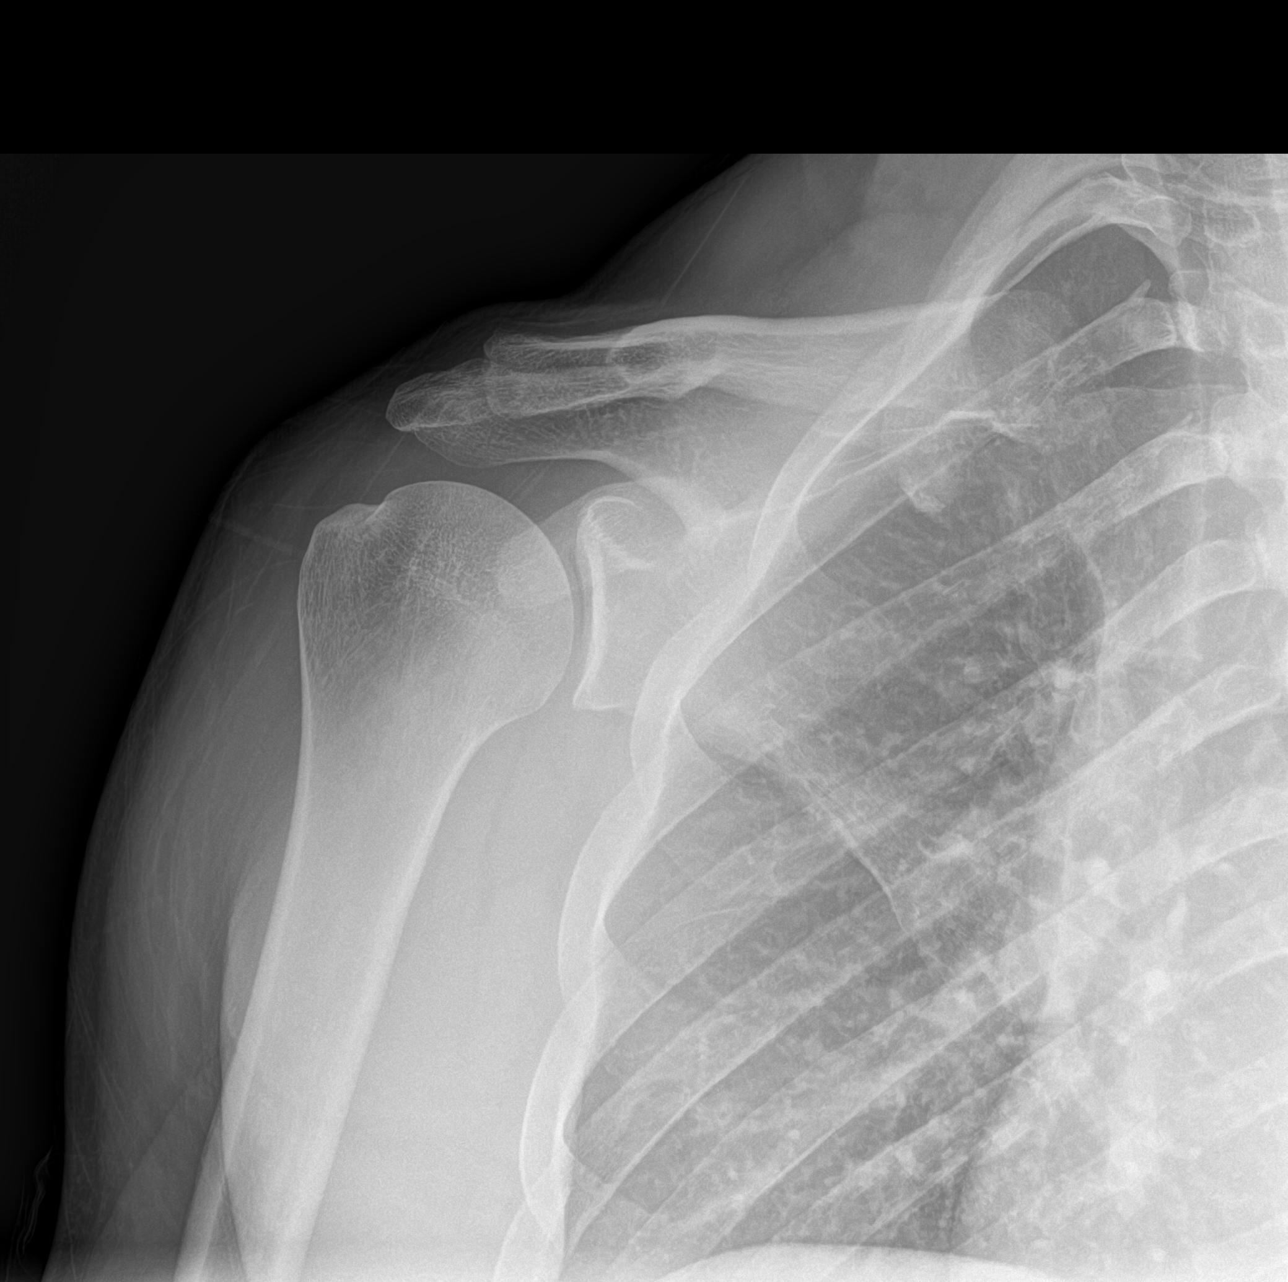

[shoulder ap (2 of 2)]
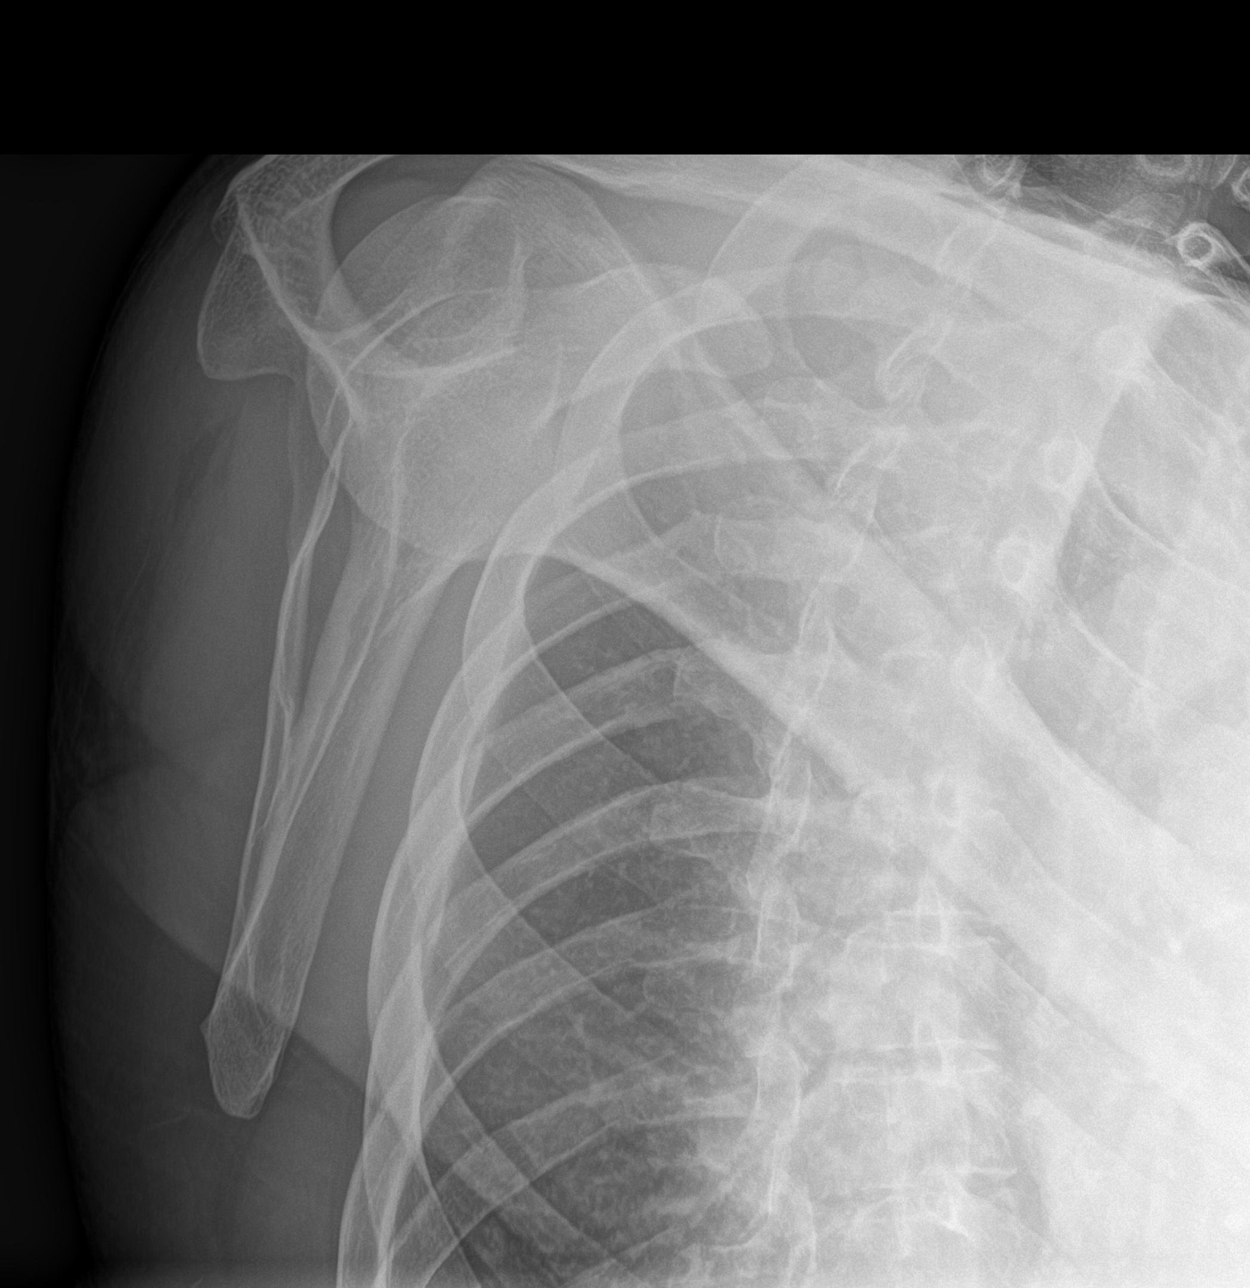

[shoulder axial]
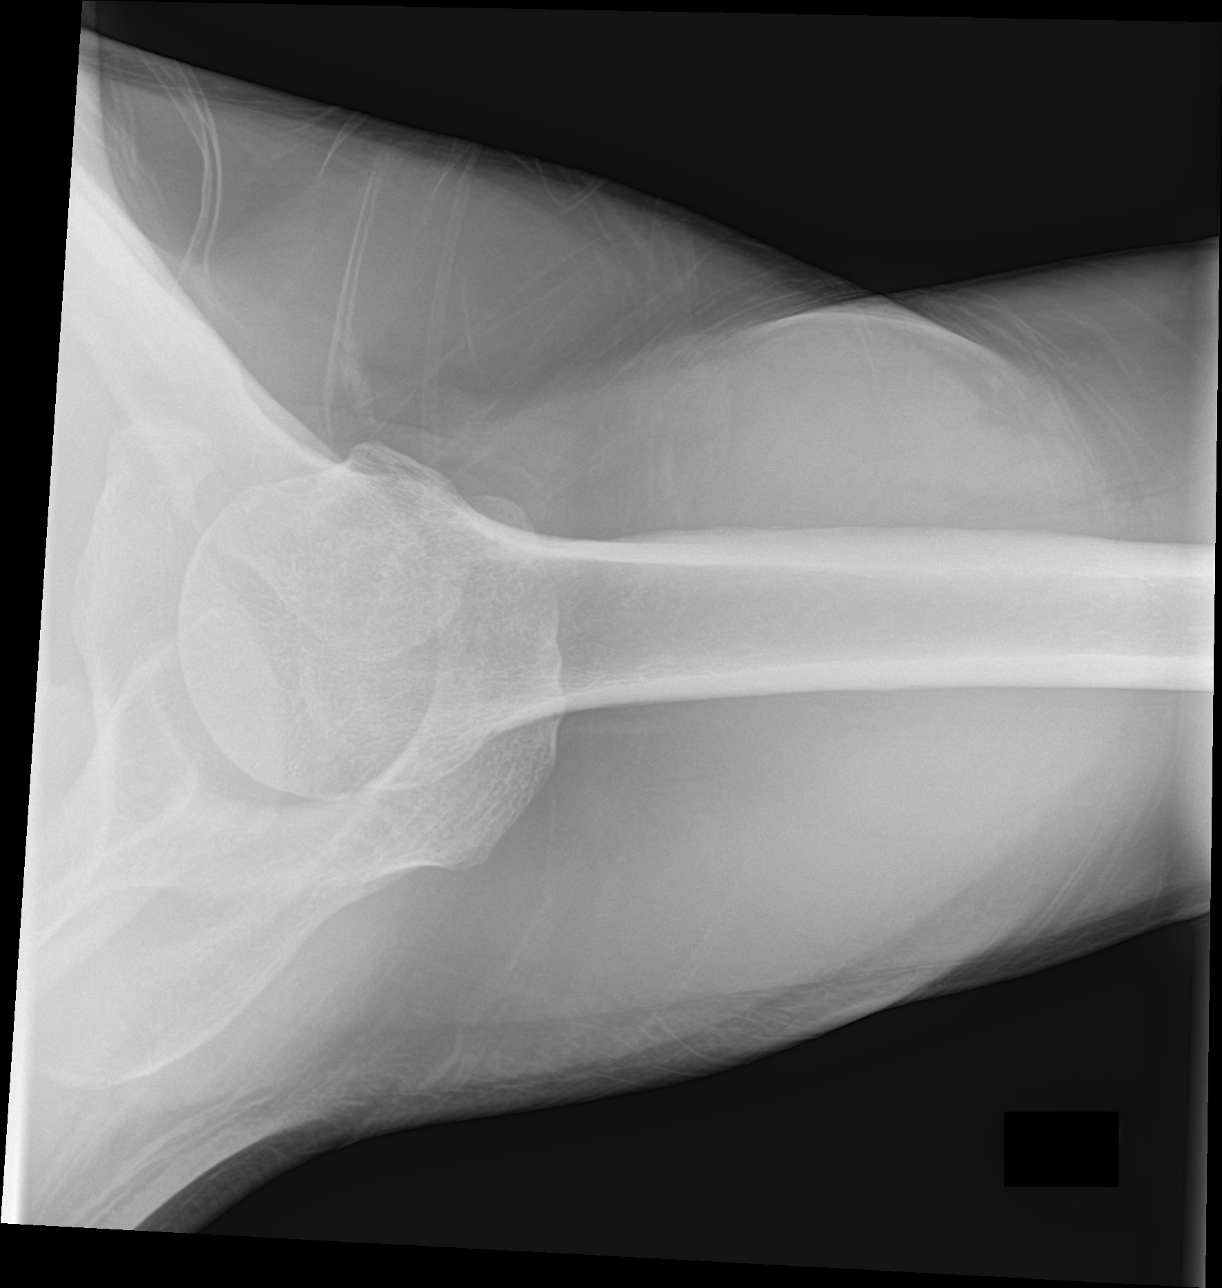

[3 of 3 positions shown; findings below may reference images not displayed]

FINDINGS: There is no evidence of fracture or dislocation. There is no
evidence of arthropathy or other focal bone abnormality. Soft
tissues are unremarkable.
IMPRESSION: Negative.

## 2021-12-30 NOTE — Progress Notes (Unsigned)
[name] [name]: [phone] Subjective:   Anthony [name], am serving as a scribe for Dr. Hulan Saas.  I'm seeing this patient by the request  of:  Anthony Mc, MD  CC: Low back pain follow-up  [id number]  Anthony [name] is a 41 y.o. male coming in with complaint of LBP. Last seen in 2022 for shoulder pain. Patient states he injured his back at the gym in the summer. Patient states that he was able to work through the pain because once he would get up and moving pain would go away. This all lasted for 3 months then the pain eventually went away. [employer] did a workout again and his low back started hurting really bad. Patient sees chiropractor and they think he has a bulging disk because of the symptoms radiating down the Left low back down the buttock and sometimes to his calf. Patient has had dry needling and the electric stim that felt good but he is still not getting better. Pain hurts when straight leg bending. Patient uses ice, heat, stretching, prednisone taper last week. Prednisone did not help at all. Patient states when doing nothing the pain is constantly dull.       No past medical history on file. Past Surgical History:  Procedure Laterality Date   NASAL SINUS SURGERY  2008   Social History   Socioeconomic History   Marital status: Married    Spouse name: Not on file   Number of children: Not on file   Years of education: Not on file   Highest education level: Not on file  Occupational History   Not on file  Tobacco Use   Smoking status: Never   Smokeless tobacco: Never  Substance and Sexual Activity   Alcohol use: Yes    Alcohol/week: 10.0 standard drinks of alcohol    Types: 10 Cans of beer per week   Drug use: No   Sexual activity: Yes  Other Topics Concern   Not on file  Social History Narrative   Not on file   Social Determinants of Health   Financial Resource  Strain: Not on file  Food Insecurity: Not on file  Transportation Needs: Not on file  Physical Activity: Not on file  Stress: Not on file  Social Connections: Not on file   Allergies  Allergen Reactions   Erythromycin Rash   Family History  Problem Relation Age of Onset   Cancer Paternal Uncle 53       prostate ca   Heart disease Paternal Uncle        died of MI   Hearing loss Paternal Uncle    Cancer Paternal Grandfather        aplastic anemia?    Arthritis Paternal Grandfather        temporal arteritis   Heart attack Brother 44   Heart Problems Maternal Grandmother    Heart Problems Maternal Grandfather     Current Outpatient Medications (Endocrine & Metabolic):    predniSONE (DELTASONE) 50 MG tablet, Take one tablet daily for the next 5 days.  Current Outpatient Medications (Cardiovascular):    nitroGLYCERIN (NITRODUR - DOSED IN MG/24 HR) 0.2 mg/hr patch, APPLY 1/4 OF A PATCH TO SKIN ONCE DAILY.  Current Outpatient Medications (Respiratory):    fluticasone (FLONASE) 50 MCG/ACT nasal spray, SPRAY 2 SPRAYS INTO EACH NOSTRIL EVERY DAY    Current Outpatient Medications (Other):    gabapentin (NEURONTIN)  100 MG capsule, Take 2 capsules (200 mg total) by mouth at bedtime.      Review of Systems:  No headache, visual changes, nausea, vomiting, diarrhea, constipation, dizziness, abdominal pain, skin rash, fevers, chills, night sweats, weight loss, swollen lymph nodes, body aches, joint swelling, chest pain, shortness of breath, mood changes. POSITIVE muscle aches  Objective  Blood pressure (!) 140/90, pulse 98, height 6' (1.829 m), weight 195 lb (88.5 kg), SpO2 96 %.   General: No apparent distress alert and oriented x3 mood and affect normal, dressed appropriately.  HEENT: Pupils equal, extraocular movements intact  Respiratory: Patient's speak in full sentences and does not appear short of breath  Cardiovascular: No lower extremity edema, non tender, no erythema   Low back pain does have loss of lordosis.  Positive straight leg test at 15 degrees of forward flexion on the left side.  Patient has radicular symptoms in the S1 distribution.  Deep tendon reflexes though are intact.  Patient is minimally tender to palpation in the paraspinal musculature on the left side.    Impression and Recommendations:    The above documentation has been reviewed and is accurate and complete Lyndal Pulley, DO

## 2022-01-01 ENCOUNTER — Ambulatory Visit: Payer: Managed Care, Other (non HMO) | Admitting: Family Medicine

## 2022-01-01 ENCOUNTER — Ambulatory Visit (INDEPENDENT_AMBULATORY_CARE_PROVIDER_SITE_OTHER): Payer: Managed Care, Other (non HMO)

## 2022-01-01 VITALS — BP 140/90 | HR 98 | Ht 72.0 in | Wt 195.0 lb

## 2022-01-01 DIAGNOSIS — M545 Low back pain, unspecified: Secondary | ICD-10-CM | POA: Diagnosis not present

## 2022-01-01 DIAGNOSIS — M5416 Radiculopathy, lumbar region: Secondary | ICD-10-CM | POA: Diagnosis not present

## 2022-01-01 MED ORDER — PREDNISONE 50 MG PO TABS: ORAL_TABLET | ORAL | 0 refills | Status: AC

## 2022-01-01 MED ORDER — GABAPENTIN 100 MG PO CAPS: 200.0000 mg | ORAL_CAPSULE | Freq: Every day | ORAL | 0 refills | Status: AC

## 2022-01-01 MED ORDER — KETOROLAC TROMETHAMINE 60 MG/2ML IM SOLN
60.0000 mg | Freq: Once | INTRAMUSCULAR | Status: AC
  Administered 2022-01-01: 60 mg via INTRAMUSCULAR

## 2022-01-01 MED ORDER — METHYLPREDNISOLONE ACETATE 80 MG/ML IJ SUSP
80.0000 mg | Freq: Once | INTRAMUSCULAR | Status: AC
  Administered 2022-01-01: 80 mg via INTRAMUSCULAR

## 2022-01-01 NOTE — Assessment & Plan Note (Signed)
Patient is having signs and symptoms significant for a left-sided lumbar radiculopathy.  Does have a positive straight leg test.  We discussed with patient about different treatment options and patient has elected to try prednisone, Toradol and Depo-Medrol given today, discussed gabapentin to help with the nerve pain.  Patient given home exercises and encouraged him to avoid heavy lifting for the next several weeks.  Patient knows that repetitive flexion can make this more worsening at this point.

## 2022-01-01 NOTE — Patient Instructions (Signed)
Xray today LB exercises Pred '50mg'$  for 5 days Gabapentin '200mg'$  at night Let us know how you are doing in 7 days If not doing well, we will consider MRI See me in 7-8 weeks otherwise

## 2022-01-02 ENCOUNTER — Encounter: Payer: Self-pay | Admitting: Family Medicine

## 2022-01-07 MED ORDER — MELOXICAM 15 MG PO TABS: 15.0000 mg | ORAL_TABLET | Freq: Every day | ORAL | 0 refills | Status: DC

## 2022-02-03 ENCOUNTER — Other Ambulatory Visit: Payer: Self-pay | Admitting: Family Medicine

## 2022-02-16 NOTE — Progress Notes (Deleted)
Anthony [name] [place] Phone: [phone] Subjective:    I'm seeing this patient by the request  of:  [name], MD  CC:   [id number]  01/01/2022 Patient is having signs and symptoms significant for a left-sided lumbar radiculopathy.  Does have a positive straight leg test.  We discussed with patient about different treatment options and patient has elected to try prednisone, Toradol and Depo-Medrol given today, discussed gabapentin to help with the nerve pain.  Patient given home exercises and encouraged him to avoid heavy lifting for the next several weeks.  Patient knows that repetitive flexion can make this more worsening at this point.     Update 02/19/2022 Anthony [name] is a 42 y.o. male coming in with complaint of L side lumbar radiculopathy. Patient states       No past medical history on file. Past Surgical History:  Procedure Laterality Date   NASAL SINUS SURGERY  2008   Social History   Socioeconomic History   Marital status: Married    Spouse name: Not on file   Number of children: Not on file   Years of education: Not on file   Highest education level: Not on file  Occupational History   Not on file  Tobacco Use   Smoking status: Never   Smokeless tobacco: Never  Substance and Sexual Activity   Alcohol use: Yes    Alcohol/week: 10.0 standard drinks of alcohol    Types: 10 Cans of beer per week   Drug use: No   Sexual activity: Yes  Other Topics Concern   Not on file  Social History Narrative   Not on file   Social Determinants of Health   Financial Resource Strain: Not on file  Food Insecurity: Not on file  Transportation Needs: Not on file  Physical Activity: Not on file  Stress: Not on file  Social Connections: Not on file   Allergies  Allergen Reactions   Erythromycin Rash   Family History  Problem Relation Age of Onset   Cancer Paternal Uncle 33       prostate ca    Heart disease Paternal Uncle        died of MI   Hearing loss Paternal Uncle    Cancer Paternal Grandfather        aplastic anemia?    Arthritis Paternal Grandfather        temporal arteritis   Heart attack Brother 44   Heart Problems Maternal Grandmother    Heart Problems Maternal Grandfather     Current Outpatient Medications (Endocrine & Metabolic):    predniSONE (DELTASONE) 50 MG tablet, Take one tablet daily for the next 5 days.  Current Outpatient Medications (Cardiovascular):    nitroGLYCERIN (NITRODUR - DOSED IN MG/24 HR) 0.2 mg/hr patch, APPLY 1/4 OF A PATCH TO SKIN ONCE DAILY.  Current Outpatient Medications (Respiratory):    fluticasone (FLONASE) 50 MCG/ACT nasal spray, SPRAY 2 SPRAYS INTO EACH NOSTRIL EVERY DAY  Current Outpatient Medications (Analgesics):    meloxicam (MOBIC) 15 MG tablet, TAKE 1 TABLET (15 MG TOTAL) BY MOUTH DAILY.   Current Outpatient Medications (Other):    gabapentin (NEURONTIN) 100 MG capsule, Take 2 capsules (200 mg total) by mouth at bedtime.   Reviewed prior external information including notes and imaging from  primary care provider As well as notes that were available from care everywhere and other healthcare systems.  Past medical history, social, surgical and  family history all reviewed in electronic medical record.  No pertanent information unless stated regarding to the chief complaint.   Review of Systems:  No headache, visual changes, nausea, vomiting, diarrhea, constipation, dizziness, abdominal pain, skin rash, fevers, chills, night sweats, weight loss, swollen lymph nodes, body aches, joint swelling, chest pain, shortness of breath, mood changes. POSITIVE muscle aches  Objective  There were no vitals taken for this visit.   General: No apparent distress alert and oriented x3 mood and affect normal, dressed appropriately.  HEENT: Pupils equal, extraocular movements intact  Respiratory: Patient's speak in full sentences and  does not appear short of breath  Cardiovascular: No lower extremity edema, non tender, no erythema      Impression and Recommendations:

## 2022-02-19 ENCOUNTER — Ambulatory Visit: Payer: Managed Care, Other (non HMO) | Admitting: Family Medicine
